# Patient Record
Sex: Female | Born: 1950 | Race: White | Hispanic: No | Marital: Single | Smoking: Former smoker
Health system: Southern US, Community
[De-identification: ages and names within clinical notes are randomized; demographics above are authoritative.]

## PROBLEM LIST (undated history)

## (undated) DIAGNOSIS — H409 Unspecified glaucoma: Secondary | ICD-10-CM

## (undated) HISTORY — PX: TONSILLECTOMY: SUR1361

## (undated) HISTORY — PX: CATARACT EXTRACTION: SUR2

---

## 2021-09-04 ENCOUNTER — Other Ambulatory Visit: Payer: Self-pay

## 2021-09-04 ENCOUNTER — Ambulatory Visit (INDEPENDENT_AMBULATORY_CARE_PROVIDER_SITE_OTHER): Payer: Medicare Other | Admitting: Podiatry

## 2021-09-04 ENCOUNTER — Encounter: Payer: Self-pay | Admitting: Podiatry

## 2021-09-04 DIAGNOSIS — B351 Tinea unguium: Secondary | ICD-10-CM | POA: Diagnosis not present

## 2021-09-04 MED ORDER — CICLOPIROX 8 % EX SOLN: Freq: Every day | CUTANEOUS | 0 refills | Status: AC

## 2021-09-04 NOTE — Progress Notes (Signed)
°  Subjective:  Patient ID: Tara Pruitt, female    DOB: 01-30-1951,   MRN: [id number]  Chief Complaint  Patient presents with   Nail Problem    Pt  came in today with discoloration of the left hallux with is starting to separate from the nail bed. Pt stated this has been going on for the last 5 years. Pt has tried over the counter nail fungus gel.      71 y.o. female presents for concern of thickened and discolored left hallux toenail that has been present for 5+ years. Has tried some listerine but nothing has helped. Not painful . Denies any other pedal complaints. Denies n/v/f/c.   No past medical history on file.  Objective:  Physical Exam: Vascular: DP/PT pulses 2/4 bilateral. CFT <3 seconds. Normal hair growth on digits. No edema.  Skin. No lacerations or abrasions bilateral feet. Discoloration and thickness noted to distal aspect of left hallux.  Musculoskeletal: MMT 5/5 bilateral lower extremities in DF, PF, Inversion and Eversion. Deceased ROM in DF of ankle joint.  Neurological: Sensation intact to light touch.   Assessment:   1. Onychomycosis      Plan:  Patient was evaluated and treated and all questions answered. -Examined patient -Discussed treatment options for painful dystrophic nails  -Discussed fungal nail treatment options including oral, topical, and laser treatments.  -Prescirpiton for penlac provided.  -Patient to return as needed.   Lorenda Peck, DPM

## 2021-11-01 ENCOUNTER — Emergency Department (HOSPITAL_BASED_OUTPATIENT_CLINIC_OR_DEPARTMENT_OTHER): Payer: Medicare Other

## 2021-11-01 ENCOUNTER — Other Ambulatory Visit: Payer: Self-pay

## 2021-11-01 ENCOUNTER — Emergency Department (HOSPITAL_BASED_OUTPATIENT_CLINIC_OR_DEPARTMENT_OTHER)
Admission: EM | Admit: 2021-11-01 | Discharge: 2021-11-01 | Disposition: A | Discharge status: Home / Self Care | Payer: Medicare Other | Attending: Emergency Medicine | Admitting: Emergency Medicine

## 2021-11-01 ENCOUNTER — Encounter (HOSPITAL_BASED_OUTPATIENT_CLINIC_OR_DEPARTMENT_OTHER): Payer: Self-pay | Admitting: Emergency Medicine

## 2021-11-01 DIAGNOSIS — M25551 Pain in right hip: Secondary | ICD-10-CM | POA: Insufficient documentation

## 2021-11-01 DIAGNOSIS — M5416 Radiculopathy, lumbar region: Secondary | ICD-10-CM

## 2021-11-01 DIAGNOSIS — M545 Low back pain, unspecified: Secondary | ICD-10-CM | POA: Diagnosis not present

## 2021-11-01 DIAGNOSIS — Z91040 Latex allergy status: Secondary | ICD-10-CM | POA: Diagnosis not present

## 2021-11-01 HISTORY — DX: Unspecified glaucoma: H40.9

## 2021-11-01 IMAGING — MR MR LUMBAR SPINE W/O CM
4 of 5 series · 26 of 48 positions shown · non-contrast
Comparison: None.

CLINICAL DATA: Low back pain radiating down the right leg for the
past month. No injury or prior surgery.

EXAM:
MRI LUMBAR SPINE WITHOUT CONTRAST
TECHNIQUE: Multiplanar, multisequence MR imaging of the lumbar spine was
performed. No intravenous contrast was administered.

[Series 3: T2 · sagittal · 4.0mm · 0.81mm/px · 6 of 15 slices shown (1 of 2)]
[im 1/15]
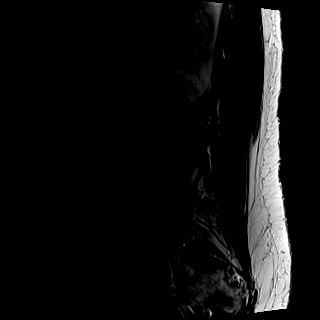
[im 3/15]
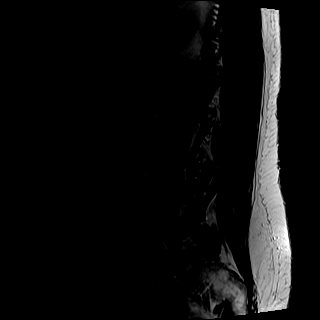
[im 6/15]
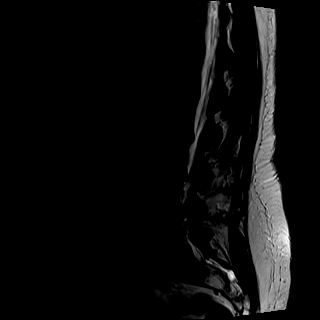
[im 9/15]
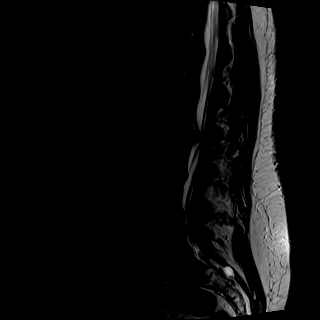
[im 12/15]
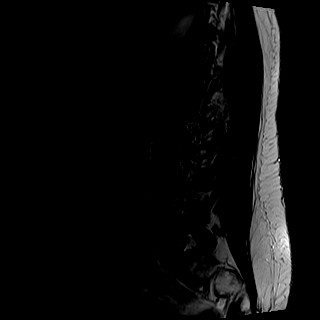
[im 15/15]
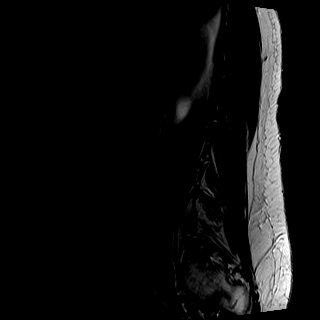

[Series 4: T1 · sagittal · 4.0mm · 0.41mm/px · 6 of 15 slices shown (1 of 2)]
[im 1/15]
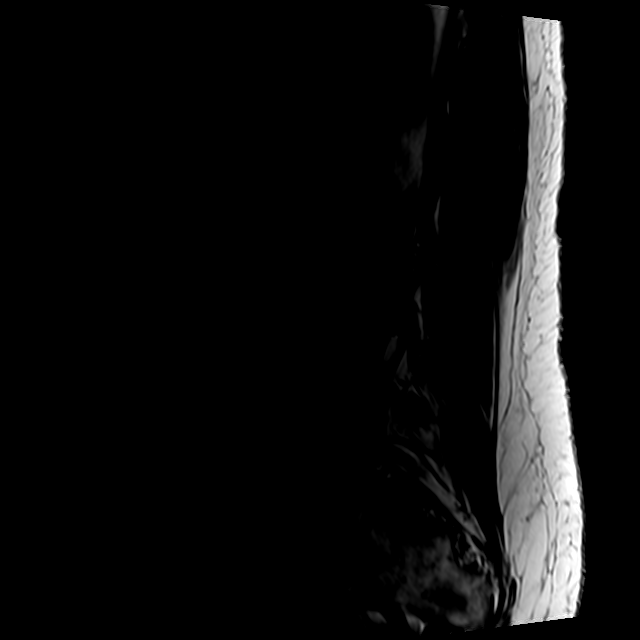
[im 3/15]
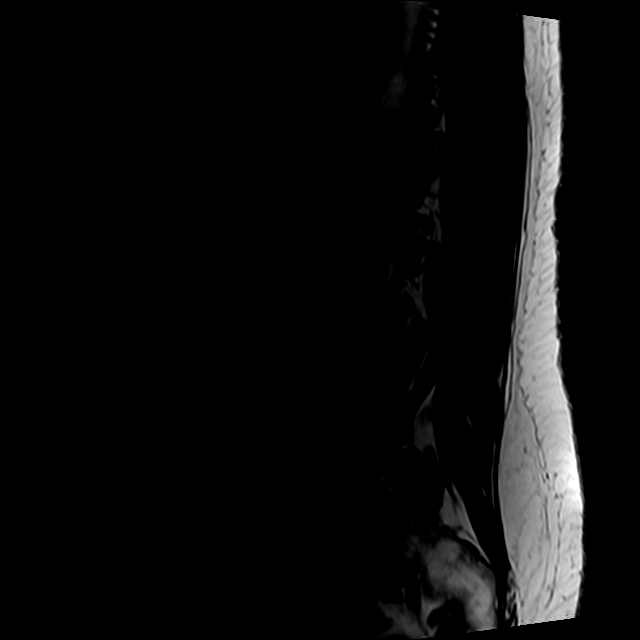
[im 6/15]
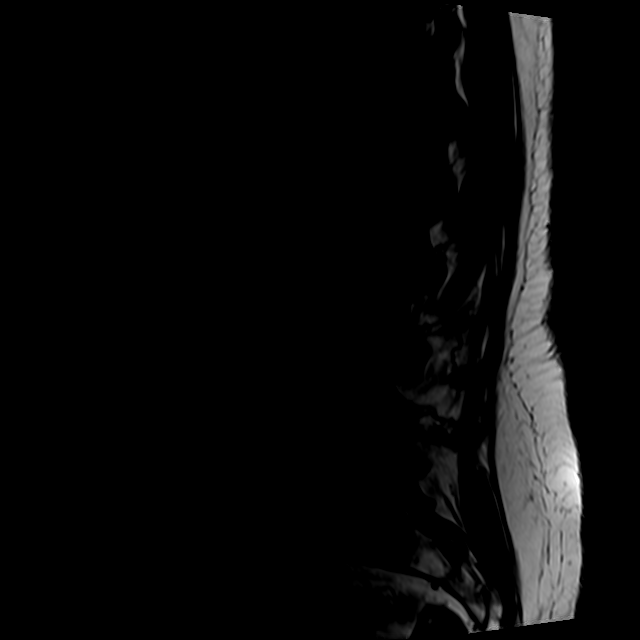
[im 9/15]
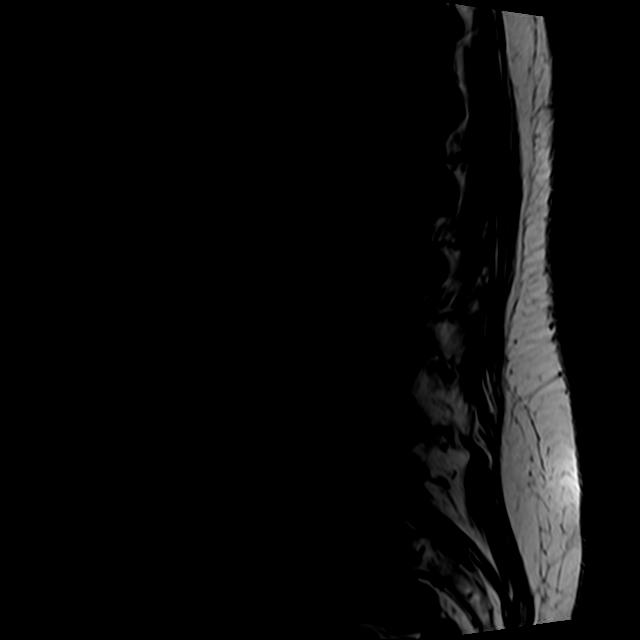
[im 12/15]
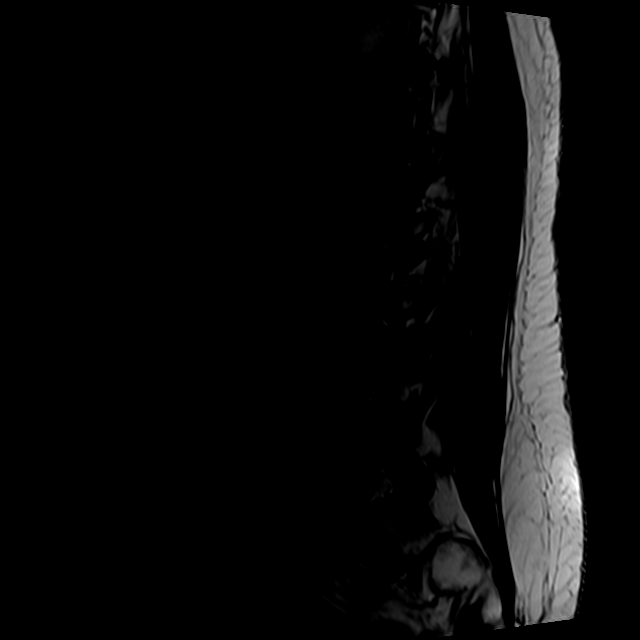
[im 15/15]
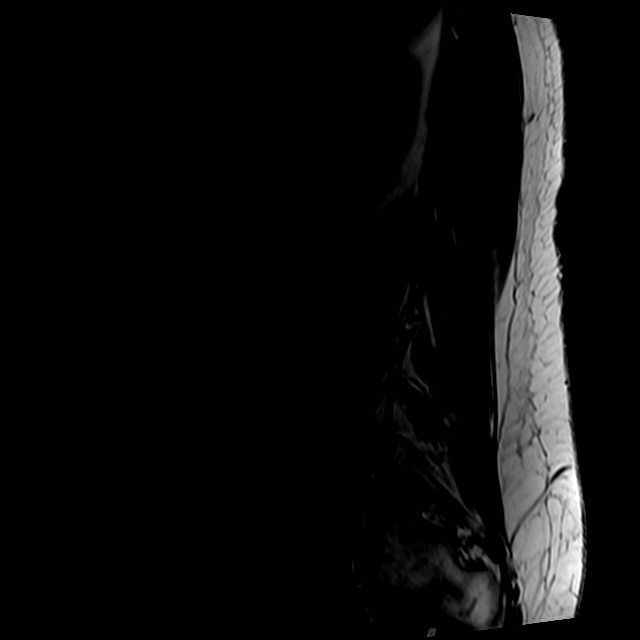

[Series 6: T2 · axial · 4.0mm · 0.78mm/px · z∈[-287,-63]mm · 9 of 40 slices shown (2 of 2)]
[im 1/40]
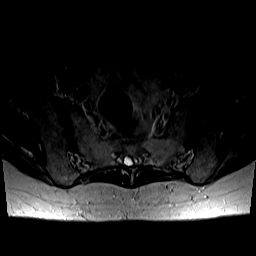
[im 6/40]
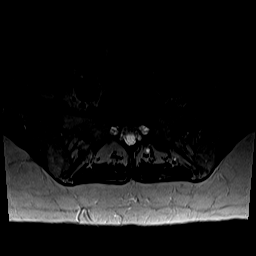
[im 12/40]
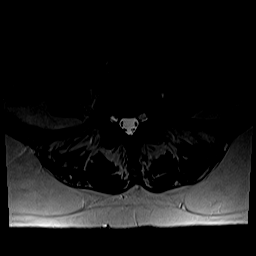
[im 17/40]
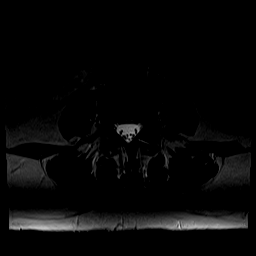
[im 20/40]
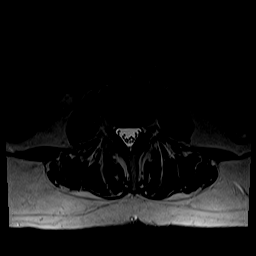
[im 23/40]
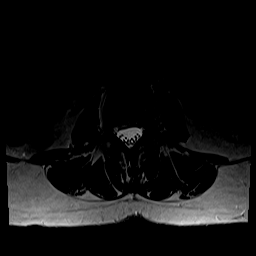
[im 28/40]
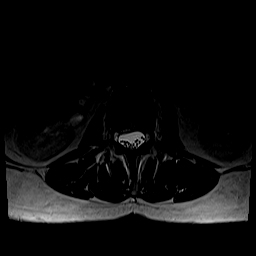
[im 34/40]
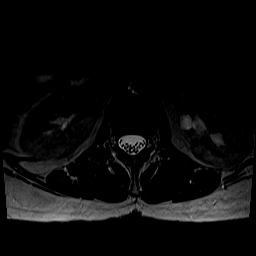
[im 40/40]
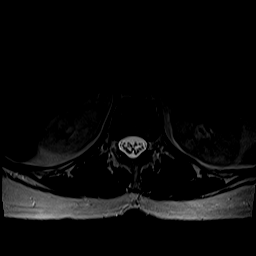

[Series 7: T1 · axial · 4.0mm · 0.39mm/px · z∈[-287,-93]mm · 5 of 40 slices shown (2 of 2)]
[im 1/40]
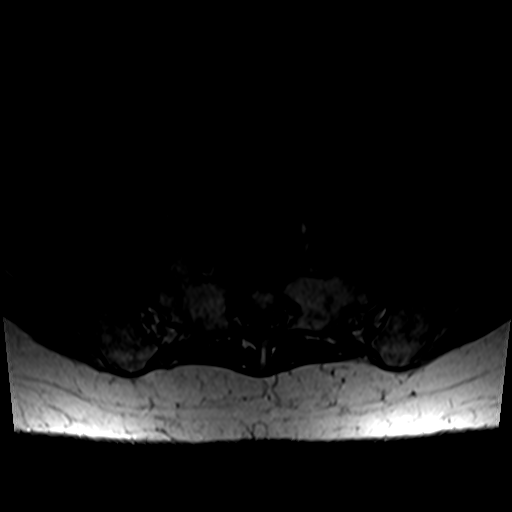
[im 6/40]
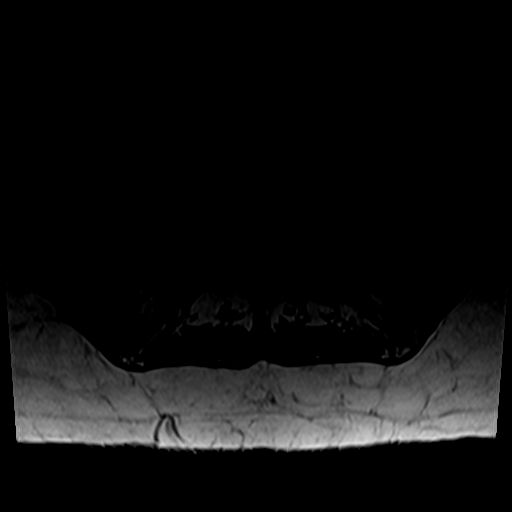
[im 12/40]
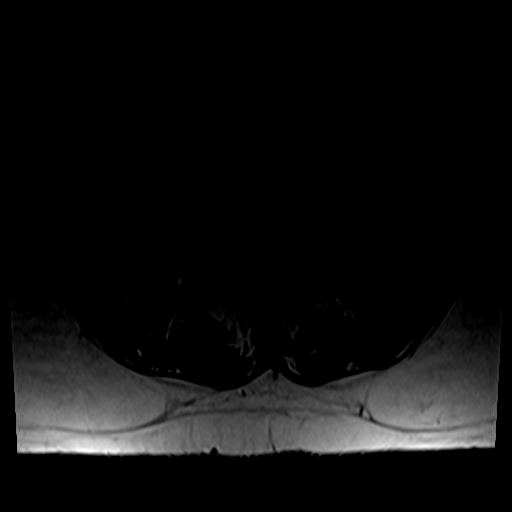
[im 20/40]
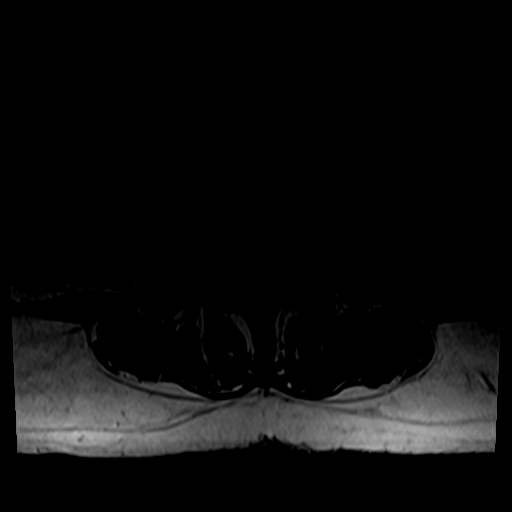
[im 34/40]
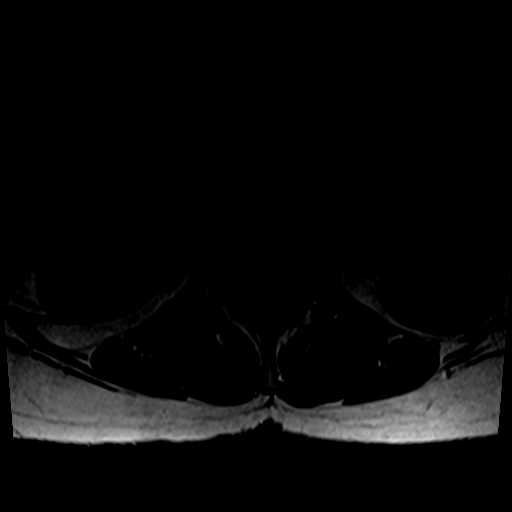

[26 of 48 positions shown; findings below may reference images not displayed]

FINDINGS: Segmentation:  Standard.

Alignment:  Trace retrolisthesis at L3-L4.

Vertebrae:  No fracture, evidence of discitis, or bone lesion.

Conus medullaris and cauda equina: Conus extends to the T12 level.
Conus and cauda equina appear normal.

Paraspinal and other soft tissues: Negative.

Disc levels:

T12-L1:  Negative.

L1-L2:  Negative.

L2-L3: Small shallow central and focal right subarticular disc
protrusions. Severe right lateral recess stenosis with impingement
of the descending right L3 nerve root. No spinal canal or
neuroforaminal stenosis.

L3-L4: Mild disc bulging. Mild to moderate left and mild right
lateral recess stenosis. No spinal canal or neuroforaminal stenosis.

L4-L5: Mild disc bulging with superimposed left foraminal and far
lateral disc protrusion. Mild bilateral facet arthropathy.
Mild-to-moderate bilateral lateral recess stenosis. Mild left
neuroforaminal stenosis. No spinal canal or right neuroforaminal
stenosis.

L5-S1: Tiny shallow central disc protrusion. Moderate left and mild
right facet arthropathy. No stenosis.
IMPRESSION: 1. Multilevel degenerative changes of the lumbar spine as described
above. Right subarticular disc protrusion at L2-L3 impinges on the
descending right L3 nerve root.
2. Mild to moderate lateral recess stenosis on the left at L3-L4 and
bilaterally at L4-L5.

## 2021-11-01 IMAGING — MR MR HIP*R* W/O CM
4 of 5 series · 29 of 40 positions shown · non-contrast
Comparison: None.

CLINICAL DATA: Low back and right leg pain for the past month. No
injury or prior surgery.

EXAM:
MR OF THE RIGHT HIP WITHOUT CONTRAST
TECHNIQUE: Multiplanar, multisequence MR imaging was performed. No intravenous
contrast was administered.

[Series 3: T1 · coronal · 4.0mm · 0.99mm/px · 8 of 36 slices shown]
[im 1/36]
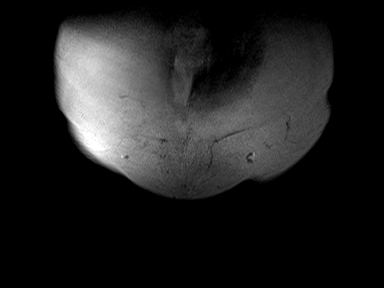
[im 4/36]
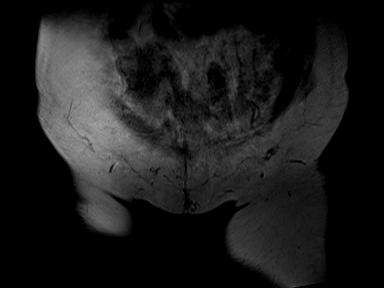
[im 12/36]
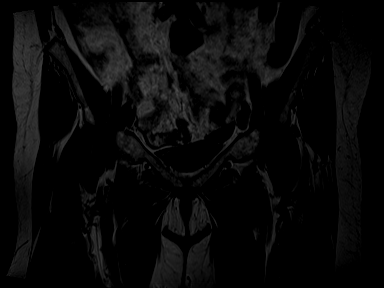
[im 16/36]
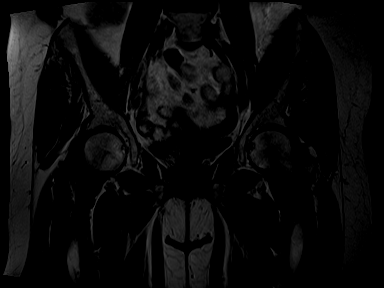
[im 20/36]
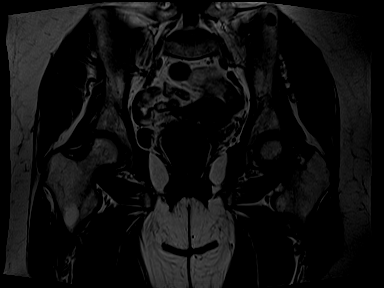
[im 24/36]
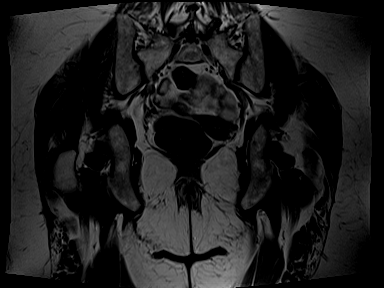
[im 32/36]
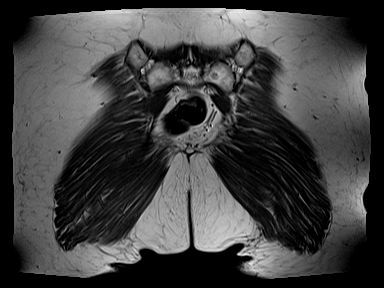
[im 36/36]
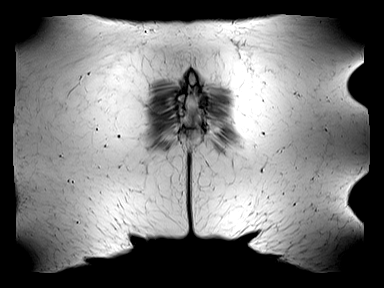

[Series 4: STIR · coronal · 4.0mm · 1.19mm/px · 8 of 36 slices shown]
[im 1/36]
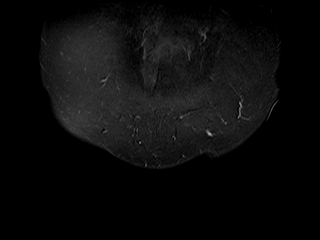
[im 5/36]
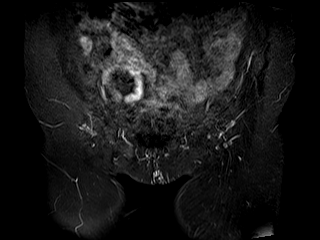
[im 9/36]
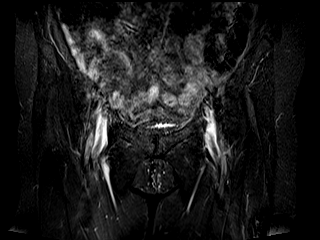
[im 14/36]
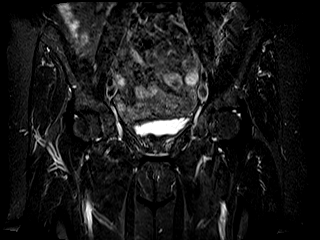
[im 18/36]
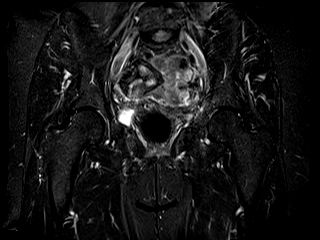
[im 22/36]
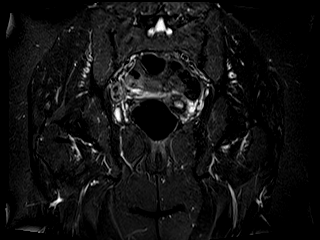
[im 27/36]
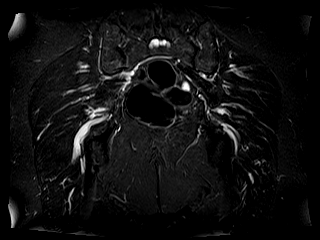
[im 31/36]
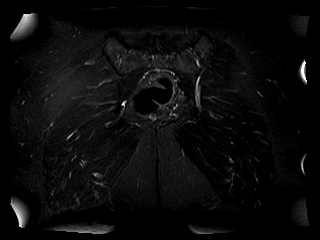

[Series 6: PD fat-sat · sagittal · 4.5mm · 0.35mm/px · 7 of 26 slices shown (1 of 2)]
[im 1/26]
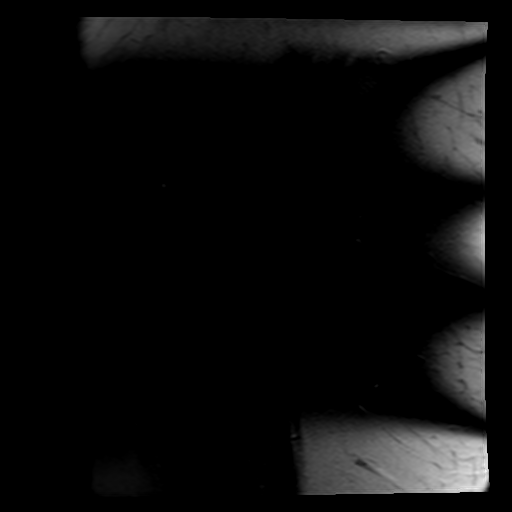
[im 5/26]
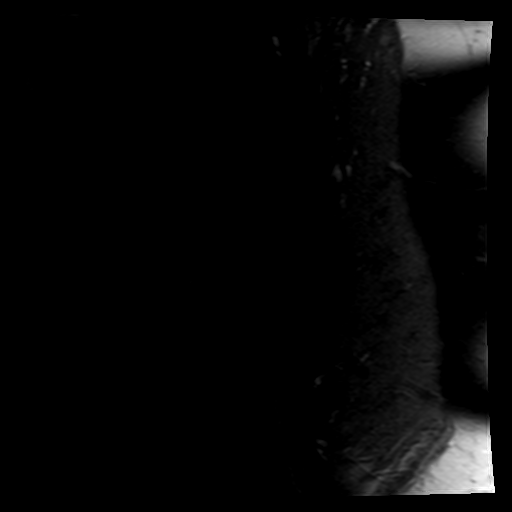
[im 9/26]
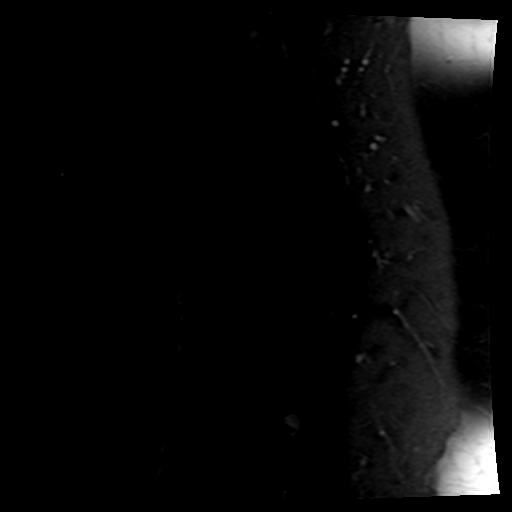
[im 13/26]
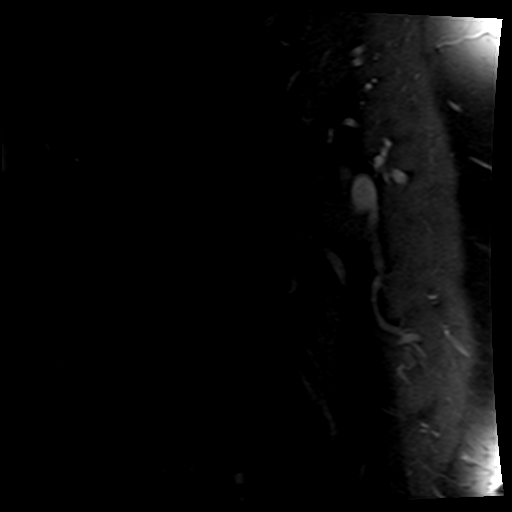
[im 17/26]
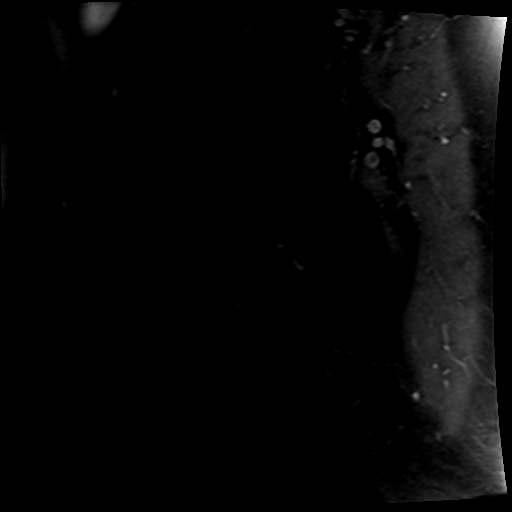
[im 21/26]
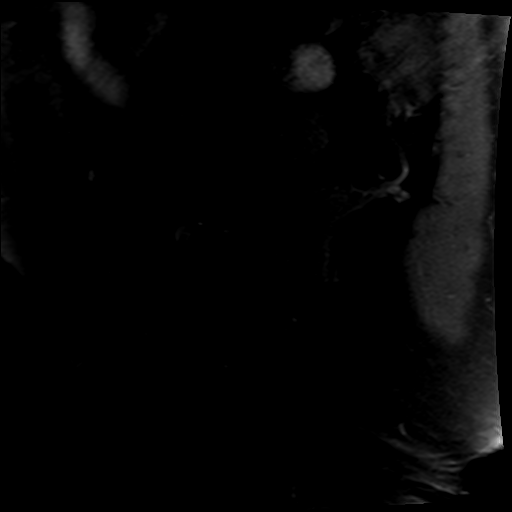
[im 26/26]
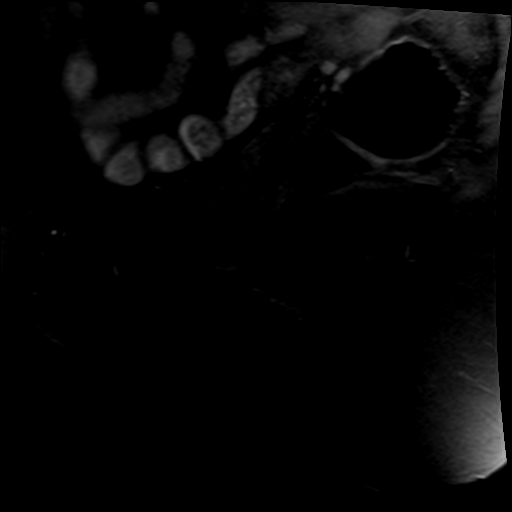

[Series 7: PD fat-sat · coronal · 4.5mm · 0.35mm/px · 6 of 23 slices shown (2 of 2)]
[im 1/23]
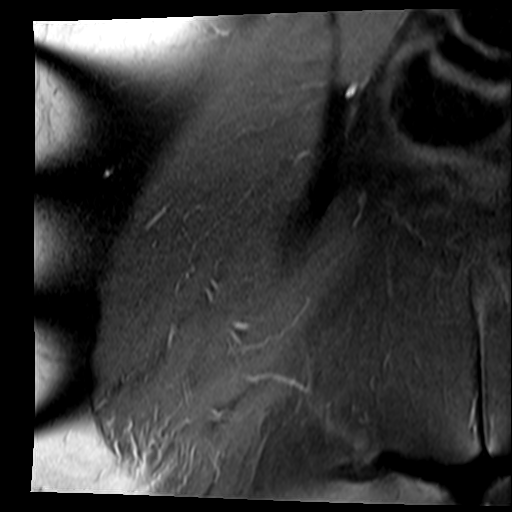
[im 5/23]
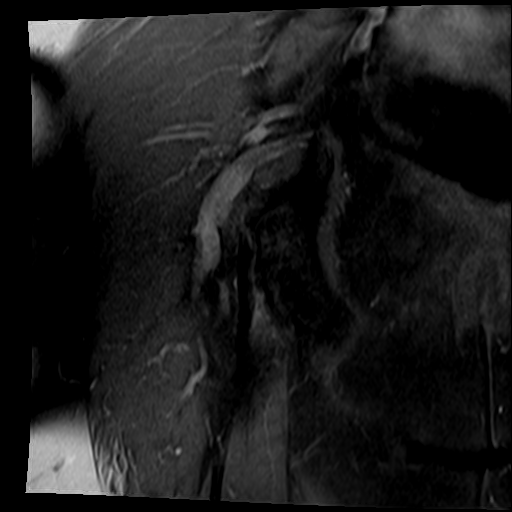
[im 9/23]
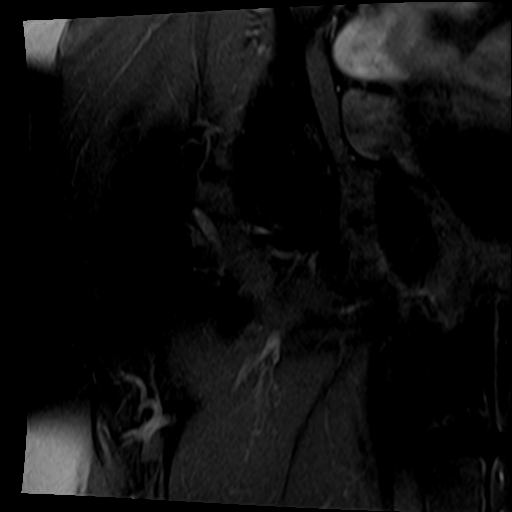
[im 14/23]
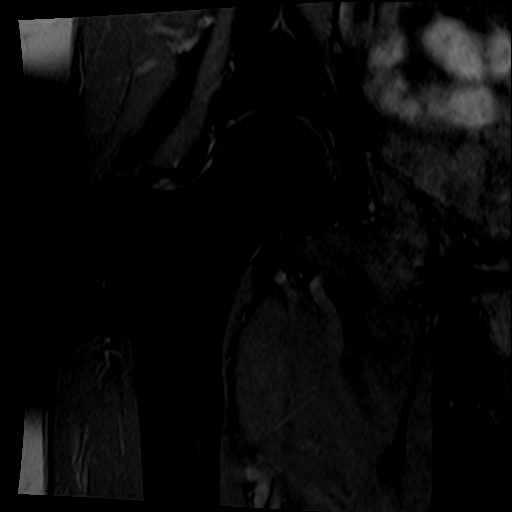
[im 18/23]
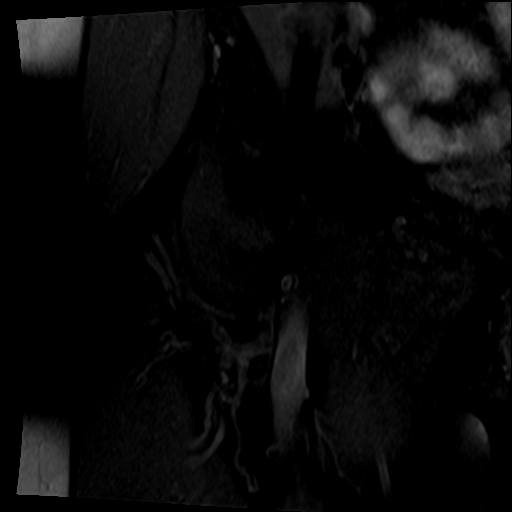
[im 23/23]
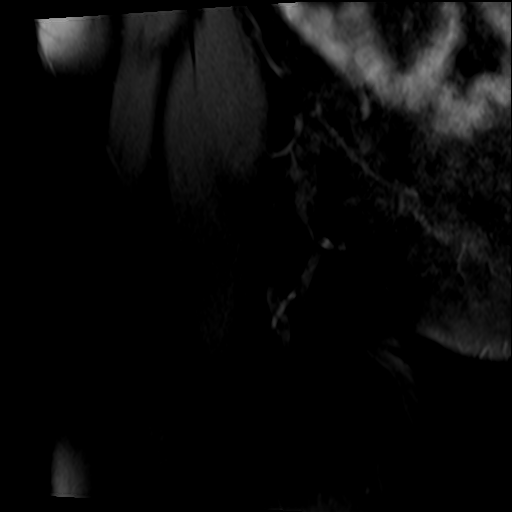

[29 of 40 positions shown; findings below may reference images not displayed]

FINDINGS: Bones: There is no evidence of acute fracture, dislocation or
avascular necrosis. No focal bone lesion. The visualized sacroiliac
joints and symphysis pubis appear normal.

Articular cartilage and labrum

Articular cartilage: Mild diffuse cartilage thinning in both hip
joints with small marginal osteophytes. No focal chondral defect.

Labrum: Grossly intact, although evaluation is limited due to lack
of intra-articular fluid. No paralabral abnormality.

Joint or bursal effusion

Joint effusion: No significant hip joint effusion.

Bursae: No focal periarticular fluid collection.

Muscles and tendons

Muscles and tendons: Mild right hamstring origin tendinosis. The
visualized gluteal, iliopsoas, and left hamstring tendons appear
normal. No muscle edema or atrophy.

Other findings

Miscellaneous: The visualized internal pelvic contents appear
unremarkable.
IMPRESSION: 1. No acute abnormality.
2. Mild bilateral hip osteoarthritis.

## 2021-11-01 MED ORDER — KETOROLAC TROMETHAMINE 60 MG/2ML IM SOLN
30.0000 mg | Freq: Once | INTRAMUSCULAR | Status: AC
  Administered 2021-11-01: 30 mg via INTRAMUSCULAR
  Filled 2021-11-01: qty 2

## 2021-11-01 MED ORDER — LIDOCAINE 5 % EX PTCH
1.0000 | MEDICATED_PATCH | CUTANEOUS | 0 refills | Status: DC
  Filled 2021-11-01: qty 30, 30d supply, fill #0

## 2021-11-01 MED ORDER — PREDNISONE 10 MG (21) PO TBPK
ORAL_TABLET | Freq: Every day | ORAL | 0 refills | Status: DC
  Filled 2021-11-01: qty 42, fill #0

## 2021-11-01 MED ORDER — OXYCODONE-ACETAMINOPHEN 5-325 MG PO TABS
2.0000 | ORAL_TABLET | Freq: Once | ORAL | Status: AC
  Administered 2021-11-01: 2 via ORAL
  Filled 2021-11-01: qty 2

## 2021-11-01 MED ORDER — PREDNISONE 10 MG (21) PO TBPK: ORAL_TABLET | Freq: Every day | ORAL | 0 refills | Status: DC

## 2021-11-01 MED ORDER — LIDOCAINE 5 % EX PTCH: 1.0000 | MEDICATED_PATCH | CUTANEOUS | 0 refills | Status: DC

## 2021-11-01 NOTE — ED Triage Notes (Signed)
Pt endorses right sided hip pain, knee pain and tingling since 3/15. Pt states she bent over to pick up a pan and felt something in her side. Pt given muscle relaxer by PCP. Pt had xray done at chiropractor and sent here for MRI.  ?

## 2021-11-01 NOTE — ED Notes (Signed)
This nurse received report from [place], EMT-P while pt OTF to MRI -- since then pt observed returning back from MRI via w/c by MRI tech; pt awake and alert; able to ambulate with 1 person min assist for safety from w/c to stretcher- no obvious acute distress noted at this time.  ?

## 2021-11-01 NOTE — ED Notes (Signed)
Patient transported to MRI 

## 2021-11-01 NOTE — Discharge Instructions (Addendum)
You have a pinched nerve at L3.  Call Dr. Luiz Blare as we discussed to schedule a follow-up visit ?

## 2021-11-01 NOTE — ED Provider Notes (Signed)
?[facility] EM[facility] DEPT ?Provider Note ? ? ?CSN: [id number] ?Arrival date & time: 11/01/21  1422 ? ?  ? ?History ? ?Chief Complaint  ?Patient presents with  ? Hip Pain  ? ? ?Tara Pruitt is a 71 y.o. female. ? ?71 year old female presents with right-sided gluteal pain x1 month.  Started after she bent over.  Since that time she has been treated by chiropractor for this.  States at times the pain goes down her right thigh.  No bowel or bladder dysfunction.  No fever or chills.  No real pain in her spine but some pain at the mid buttock around the SI joint region.  No foot drop with walking.  States that the pain is positional with it being better with certain positions of her pelvis.  Has been using heat and ice at times with some relief.  Is also been using muscle axis with some relief ? ? ?  ? ?Home Medications ?Prior to Admission medications   ?Medication Sig Start Date End Date Taking? Authorizing Provider  ?ciclopirox (PENLAC) 8 % solution Apply topically at bedtime. Apply over nail and surrounding skin. Apply daily over previous coat. After seven (7) days, may remove with alcohol and continue cycle. 09/04/21   Louann Sjogren, MD  ?   ? ?Allergies    ?Codeine and Latex   ? ?Review of Systems   ?Review of Systems  ?All other systems reviewed and are negative. ? ?Physical Exam ?Updated Vital Signs ?BP (!) 163/96 (BP Location: Right Arm)   Pulse 94   Temp 97.7 ?F (36.5 ?C)   Resp 19   Ht 1.537 m (5' 0.5")   Wt 68 kg   SpO2 100%   BMI 28.81 kg/m?  ?Physical Exam ?Vitals and nursing note reviewed.  ?Constitutional:   ?   General: She is not in acute distress. ?   Appearance: Normal appearance. She is well-developed. She is not toxic-appearing.  ?HENT:  ?   Head: Normocephalic and atraumatic.  ?Eyes:  ?   General: Lids are normal.  ?   Conjunctiva/sclera: Conjunctivae normal.  ?   Pupils: Pupils are equal, round, and reactive to light.  ?Neck:  ?   Thyroid: No thyroid mass.  ?   Trachea: No  tracheal deviation.  ?Cardiovascular:  ?   Rate and Rhythm: Normal rate and regular rhythm.  ?   Heart sounds: Normal heart sounds. No murmur heard. ?  No gallop.  ?Pulmonary:  ?   Effort: Pulmonary effort is normal. No respiratory distress.  ?   Breath sounds: Normal breath sounds. No stridor. No decreased breath sounds, wheezing, rhonchi or rales.  ?Abdominal:  ?   General: There is no distension.  ?   Palpations: Abdomen is soft.  ?   Tenderness: There is no abdominal tenderness. There is no rebound.  ?Musculoskeletal:     ?   General: No tenderness. Normal range of motion.  ?   Cervical back: Normal range of motion and neck supple.  ?     Legs: ? ?Skin: ?   General: Skin is warm and dry.  ?   Findings: No abrasion or rash.  ?Neurological:  ?   General: No focal deficit present.  ?   Mental Status: She is alert and oriented to person, place, and time. Mental status is at baseline.  ?   GCS: GCS eye subscore is 4. GCS verbal subscore is 5. GCS motor subscore is 6.  ?   Cranial  Nerves: No cranial nerve deficit.  ?   Sensory: No sensory deficit.  ?   Motor: Motor function is intact.  ?   Gait: Gait is intact.  ?Psychiatric:     ?   Attention and Perception: Attention normal.     ?   Speech: Speech normal.     ?   Behavior: Behavior normal.  ? ? ?ED Results / Procedures / Treatments   ?Labs ?(all labs ordered are listed, but only abnormal results are displayed) ?Labs Reviewed - No data to display ? ?EKG ?None ? ?Radiology ?No results found. ? ?Procedures ?Procedures  ? ? ?Medications Ordered in ED ?Medications  ?ketorolac (TORADOL) injection 30 mg (has no administration in time range)  ? ? ?ED Course/ Medical Decision Making/ A&P ?  ?                        ?Medical Decision Making ?Amount and/or Complexity of Data Reviewed ?Radiology: ordered. ? ?Risk ?Prescription drug management. ? ? ?Patient here complaining of right gluteal pain.  Given Toradol 30 mg IM for this.  Did note some relief with this.  Concern for  possible SI joint versus lumbar sacral pathology.  Given pain relief with 1 Percocet.  Patient subsequently had a MRI of her right hip and LS-spine.  Has evidence of disc protrusion at L2/L3 causing impingement of the L3 nerve root.  This was per my review and interpretation.  Patient will be placed on prednisone taper along with given lidocaine patches.  She will follow-up with her orthopedist ? ? ? ? ? ? ? ?Final Clinical Impression(s) / ED Diagnoses ?Final diagnoses:  ?None  ? ? ?Rx / DC Orders ?ED Discharge Orders   ? ? None  ? ?  ? ? ?  ?Lorre Nick, MD ?11/01/21 2016 ? ?

## 2021-11-01 NOTE — ED Notes (Signed)
ED Provider at bedside speaking pt, sitting up to chair, with spouse at her side  ?

## 2021-11-01 NOTE — ED Notes (Signed)
Pt agreeable with d/c plan as discussed by provider- this nurse has verbally reinforced d/c instructions and provided pt with written copy - pt acknowledges verbal understanding and denies any additional questions, concerns, needs- pt escorted to exit/car via w/c by this nurse and transported home by spouse.   ?

## 2021-11-02 ENCOUNTER — Encounter (HOSPITAL_BASED_OUTPATIENT_CLINIC_OR_DEPARTMENT_OTHER): Payer: Self-pay

## 2021-11-02 NOTE — ED Notes (Addendum)
Pt called regarding her newly prescribed Prednisone from Dr. Zenia Resides. She noticed Prednisone being a "caution" with a medical hx of Glaucoma. Pt encouraged to call pharmacy and/or PCP  ?

## 2021-11-04 ENCOUNTER — Other Ambulatory Visit (HOSPITAL_BASED_OUTPATIENT_CLINIC_OR_DEPARTMENT_OTHER): Payer: Self-pay

## 2022-05-01 ENCOUNTER — Ambulatory Visit (INDEPENDENT_AMBULATORY_CARE_PROVIDER_SITE_OTHER): Payer: Medicare Other | Admitting: Nurse Practitioner

## 2022-05-01 ENCOUNTER — Encounter: Payer: Self-pay | Admitting: Nurse Practitioner

## 2022-05-01 ENCOUNTER — Ambulatory Visit: Payer: Medicare Other | Admitting: Nurse Practitioner

## 2022-05-01 ENCOUNTER — Telehealth: Payer: Self-pay | Admitting: Nurse Practitioner

## 2022-05-01 VITALS — BP 160/92 | HR 101 | Temp 97.7°F | Ht 65.5 in | Wt 153.0 lb

## 2022-05-01 DIAGNOSIS — Z79899 Other long term (current) drug therapy: Secondary | ICD-10-CM

## 2022-05-01 DIAGNOSIS — R42 Dizziness and giddiness: Secondary | ICD-10-CM

## 2022-05-01 DIAGNOSIS — R030 Elevated blood-pressure reading, without diagnosis of hypertension: Secondary | ICD-10-CM

## 2022-05-01 DIAGNOSIS — H9192 Unspecified hearing loss, left ear: Secondary | ICD-10-CM | POA: Diagnosis not present

## 2022-05-01 DIAGNOSIS — H409 Unspecified glaucoma: Secondary | ICD-10-CM

## 2022-05-01 DIAGNOSIS — M545 Low back pain, unspecified: Secondary | ICD-10-CM

## 2022-05-01 DIAGNOSIS — Z7689 Persons encountering health services in other specified circumstances: Secondary | ICD-10-CM

## 2022-05-01 NOTE — Telephone Encounter (Signed)
MADE BY MISTAKE. SPOKE TO PROVIDER IN REGARDS TO [facility] audiology

## 2022-05-01 NOTE — Progress Notes (Signed)
New Patient Office Visit  Assessment & Plan  Encounter to establish care Will continue to review history and medical records from Dr. Pattricia Boss. DesChamps,  Hearing loss of left ear, unspecified hearing loss type Refer to audiology for further review and evaluation. Continue to monitor  Dizziness Change positions slowly to prevent increase risk for fall. Stay well hydrated.  Glaucoma, unspecified glaucoma type, unspecified laterality Continue Lantanoprost. Continue to monitor   Elevated blood pressure reading in office with white coat syndrome, without diagnosis of hypertension Discussed DASH (Dietary Approaches to Stop Hypertension) DASH diet is lower in sodium than a typical American diet. Cut back on foods that are high in saturated fat, cholesterol, and trans fats. Eat more whole-grain foods, fish, poultry, and nuts Remain active and exercise as tolerated daily.  Monitor BP at home-Call if greater than 130/80.  Report to ER for any increase in stroke like symptoms, including HA, N/V, paralysis, difficulty speaking, trouble walking, confusion, vision changes, CP, heart palpitations, SOB, diaphoresis.  Medication management All medications discussed and reviewed in full. All questions and concerns regarding medications addressed.    Orders Placed This Encounter  Procedures   Ambulatory referral to Audiology    Referral Priority:   Routine    Referral Type:   Audiology Exam    Referral Reason:   Specialty Services Required    Number of Visits Requested:   1   Return in 3-6 months or sooner if needed.  Notify office for further evaluation and treatment, questions or concerns if any reported s/s fail to improve.   The patient was advised to call back or seek an in-person evaluation if any symptoms worsen or if the condition fails to improve as anticipated.   Further disposition pending results of labs if blood work obtained. Discussed med's effects and SE's.    I  discussed the assessment and treatment plan with the patient. The patient was provided an opportunity to ask questions and all were answered. The patient agreed with the plan and demonstrated an understanding of the instructions.  Discussed med's effects and SE's. Screening labs and tests as requested with regular follow-up as recommended.  I provided 30 minutes of face-to-face time during this encounter including counseling, chart review, and critical decision making was preformed.   Subjective    Patient ID: Tara Pruitt, female    DOB: 02-Apr-1951  Age: 71 y.o. MRN: [id number]  CC:  Chief Complaint  Patient presents with   Establish Care    HPI Tara Pruitt presents to establish care.  She is a very pleasant female that shares around 04/2020 she retired and moved from [place].  She was followed by Pattricia Boss. DesChamps, MD.  She has recently seen a provider in the last 6 mo.  She is most concerned for left ear hearing loss that occurred approximately 13 years ago.  Happened spontaneously.  Onset was accompanied by severe dizzy spells about 4 moo prior to the hearing loss.  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  She now reports that the dizzy spell has returned, and this has her concerned for los of hearing in the right ear. She is very tearful today and in clinic regarding this issue.  Has followed with [facility] - Dr. Berenice Primas, 12/18/21 for Cortisone injection due to low back pain.  States injections are helpful helped.  Notes onset around 10/02/21 when "something popped" in her  back.  She mainly takes supplements for healthcare  maintenance.  She is very diligent about eating health.  She aims to exercise 4-5 times weekly. She does use Lantaprost for treatment of glaucoma.  She has had a hx of cataract surgery.    She follows with [facility], Dr. Lorane Gell.  Last seen  08/2021 for pap.  She does not have a hx of abnormal pap.  BP during that visit was well controlled at 128/74.  She has also seen Podiatry, Dr. Ralene Cork for treatment of Onychomycosis to left hallux.  Onset 5 years ago.   Last f/u was 09/04/21.  She was prescribed a prescription for Penlac and was asked to return PRN.  She is a former smoker.  Quit 1989.  Her blood pressure is elevated in clinic today. She reports being anxious and stressed about the hearing loss and seeking a new provider.  She denies hx of HTN.  She is asymptomatic.  Denies chest pain, heart palpitations, difficulty breathing. She reports seeing a cardiologist in the past and work up was negative.     Outpatient Encounter Medications as of 05/01/2022  Medication Sig   ascorbic acid (QC VITAMIN C WITH ROSE HIPS) 500 MG tablet Take 500 mg by mouth daily.   Ascorbic Acid (VITAMIN C) 100 MG tablet Take 100 mg by mouth daily.   B COMPLEX-C PO Take 150 mg by mouth daily.   Calcium Carbonate (CALCIUM 600 PO) Take by mouth daily.   cholecalciferol (VITAMIN D3) 25 MCG (1000 UNIT) tablet Take 5,000 Units by mouth daily.   Coenzyme Q10 (CO Q 10) 100 MG CAPS Take by mouth daily.   latanoprost (XALATAN) 0.005 % ophthalmic solution Place 1 drop into both eyes at bedtime.   Multiple Vitamin (MULTIVITAMIN) tablet Take 1 tablet by mouth daily.   naproxen sodium (ALEVE) 220 MG tablet Take 220 mg by mouth 2 (two) times daily.   POTASSIUM PO Take by mouth daily.   QUERCETIN PO Take 800 mg by mouth 2 (two) times daily.   Selenium 200 MCG CAPS Take by mouth. Take 1/2 tablet in the morning and at night   TART CHERRY PO Take by mouth daily.   vitamin E 1000 UNIT capsule Take 1,000 Units by mouth daily.   zinc gluconate 50 MG tablet Take 50 mg by mouth daily.   Barberry-Oreg Grape-Goldenseal (BERBERINE COMPLEX) 200-200-50 MG CAPS Take by mouth. (Patient not taking: Reported on 05/01/2022)   ciclopirox (PENLAC) 8 % solution Apply topically at  bedtime. Apply over nail and surrounding skin. Apply daily over previous coat. After seven (7) days, may remove with alcohol and continue cycle. (Patient not taking: Reported on 05/01/2022)   lidocaine (LIDODERM) 5 % Place 1 patch onto the skin daily. Remove & Discard patch within 12 hours or as directed by MD (Patient not taking: Reported on 05/01/2022)   magnesium 30 MG tablet Take 30 mg by mouth 2 (two) times daily. (Patient not taking: Reported on 05/01/2022)   methocarbamol (ROBAXIN) 500 MG tablet Take 500 mg by mouth 4 (four) times daily. (Patient not taking: Reported on 05/01/2022)   predniSONE (STERAPRED UNI-PAK 21 TAB) 10 MG (21) TBPK tablet Take by mouth daily. Take 6 tabs by mouth daily  for 2 days, then 5 tabs for 2 days, then 4 tabs for 2 days, then 3 tabs for 2 days, 2 tabs for 2 days, then 1 tab by mouth daily for 2 days (Patient not taking: Reported on 05/01/2022)  No facility-administered encounter medications on file as of 05/01/2022.    Past Medical History:  Diagnosis Date   Glaucoma     Past Surgical History:  Procedure Laterality Date   CATARACT EXTRACTION     CESAREAN SECTION     TONSILLECTOMY      No family history on file.  Social History   Socioeconomic History   Marital status: Unknown    Spouse name: Not on file   Number of children: Not on file   Years of education: Not on file   Highest education level: Not on file  Occupational History   Not on file  Tobacco Use   Smoking status: Former    Types: Cigarettes    Quit date: 1989    Years since quitting: 34.8   Smokeless tobacco: Never  Substance and Sexual Activity   Alcohol use: Never   Drug use: Never   Sexual activity: Not on file  Other Topics Concern   Not on file  Social History Narrative   Not on file   Social Determinants of Health   Financial Resource Strain: Not on file  Food Insecurity: Not on file  Transportation Needs: Not on file  Physical Activity: Not on file  Stress:  Not on file  Social Connections: Not on file  Intimate Partner Violence: Not on file    Review of Systems  Constitutional: Negative.   HENT:  Positive for hearing loss.   Eyes: Negative.   Respiratory: Negative.    Cardiovascular: Negative.   Gastrointestinal: Negative.   Genitourinary: Negative.   Musculoskeletal: Negative.   Skin: Negative.   Neurological:  Positive for dizziness.  Psychiatric/Behavioral:  The patient is nervous/anxious.       Objective    BP (!) 160/92   Pulse (!) 101   Temp 97.7 F (36.5 C)   Ht 5' 5.5" (1.664 m)   Wt 153 lb (69.4 kg)   SpO2 99%   BMI 25.07 kg/m   Physical Exam Constitutional:      Appearance: Normal appearance.  HENT:     Head: Normocephalic.     Right Ear: Tympanic membrane normal.     Left Ear: Tympanic membrane normal.     Nose: Nose normal.     Mouth/Throat:     Mouth: Mucous membranes are moist.  Eyes:     Pupils: Pupils are equal, round, and reactive to light.  Cardiovascular:     Rate and Rhythm: Normal rate and regular rhythm.     Pulses: Normal pulses.     Heart sounds: Normal heart sounds.  Pulmonary:     Effort: Pulmonary effort is normal.     Breath sounds: Normal breath sounds.  Abdominal:     General: Abdomen is flat.  Musculoskeletal:        General: Normal range of motion.     Cervical back: Normal range of motion.  Skin:    General: Skin is warm.  Neurological:     Mental Status: She is alert.  Psychiatric:        Mood and Affect: Mood normal.        Thought Content: Thought content normal.     Comments: tearful     No follow-ups on file.   Adela Glimpse, NP

## 2022-05-01 NOTE — Patient Instructions (Signed)
    RE: MyChart  Dear Ms. Schaad  MyChart makes it easy for you to view your health information - all in one secure location - from any computer or mobile device at any time. Use the activation code below to enroll in MyChart online at [email or web address]   Once your account is activated, you can:  View your test results. Communicate securely with your physician's office.  View your medical history, allergies, medications, and immunizations. Receive care virtually through an e-Visit.   If you are over 18, you may use features of MyChart to manage the health information of your spouse, children or others you care for.  Download child and adult access forms at [email or web address].    As you activate your MyChart account and need any technical assistance, please call the MyChart technical support line at [phone]).  Be sure to also download the MyChart app for your mobile device.   Thank you for choosing [facility] for your family's health care needs!   MyChart Activation Code:  [email or web address] Expires: 06/09/2022  8:40 AM             [facility]  [address] [place], [place] [address]

## 2022-05-07 ENCOUNTER — Ambulatory Visit: Payer: Medicare Other | Admitting: Nurse Practitioner

## 2022-05-22 ENCOUNTER — Encounter: Payer: Self-pay | Admitting: Nurse Practitioner

## 2022-05-22 ENCOUNTER — Ambulatory Visit (INDEPENDENT_AMBULATORY_CARE_PROVIDER_SITE_OTHER): Payer: Medicare Other | Admitting: Nurse Practitioner

## 2022-05-22 VITALS — BP 156/96 | HR 92 | Temp 97.3°F | Ht 65.5 in | Wt 155.4 lb

## 2022-05-22 DIAGNOSIS — H9192 Unspecified hearing loss, left ear: Secondary | ICD-10-CM

## 2022-05-22 DIAGNOSIS — R030 Elevated blood-pressure reading, without diagnosis of hypertension: Secondary | ICD-10-CM

## 2022-05-22 DIAGNOSIS — R42 Dizziness and giddiness: Secondary | ICD-10-CM

## 2022-05-22 NOTE — Patient Instructions (Signed)

## 2022-05-22 NOTE — Progress Notes (Signed)
New Patient Office Visit  Assessment & Plan  Hearing loss of left ear, unspecified hearing loss type Continue referral to Audiology.  Elevated blood pressure reading in office with white coat syndrome, without diagnosis of hypertension Reviewed BP log - controlled. Continue to focus on DASH (Dietary Approaches to Stop Hypertension) DASH diet is lower in sodium than a typical American diet. Cut back on foods that are high in saturated fat, cholesterol, and trans fats. Eat more whole-grain foods, fish, poultry, and nuts Remain active and exercise as tolerated daily.  Monitor BP at home-Call if greater than 130/80.  Report to ER for any increase in stroke like symptoms, including HA, N/V, paralysis, difficulty speaking, trouble walking, confusion, vision changes, CP, heart palpitations, SOB, diaphoresis.  Dizziness/Electromagnetic hypersensitivity Paperwork to be reviewed by MD Continue to monitor  No orders of the defined types were placed in this encounter.  Notify office for further evaluation and treatment, questions or concerns if any reported s/s fail to improve.   The patient was advised to call back or seek an in-person evaluation if any symptoms worsen or if the condition fails to improve as anticipated.   Further disposition pending results of labs if blood work obtained. Discussed med's effects and SE's.    I discussed the assessment and treatment plan with the patient. The patient was provided an opportunity to ask questions and all were answered. The patient agreed with the plan and demonstrated an understanding of the instructions.  Discussed med's effects and SE's. Screening labs and tests as requested with regular follow-up as recommended.  I provided 20 minutes of face-to-face time during this encounter including counseling, chart review, and critical decision making was preformed.   Subjective    Patient ID: Tara Pruitt, female    DOB: 07-01-1951  Age: 71 y.o.  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    Currently waiting on medical records for review by last provider, Karalee Height. DesChamps, MD, [place].  She has a audiology referral pending for review and evaluation of the left ear sensorineural hearing loss.   She has not had any increase in symptoms in the right ear however, she does report that she feels as though some of her dizziness is caused by electromagnetic sensitivity.   Reports electrical company wanting to change out her electrical meter to a smart meter.  She requests that this not be completed d/t the high amount of frequency the smart meter will emit.  She feels as though this will worsen symptoms. Reports a hx of feeling syncopal during these times,associated HA and overall feeling of being unwell.  These symptoms can also occur while talking on her cell phone.  States that she only uses speaker phone and does not hold the device up to the ear.  She has a form from the electrical company that she is requesting to be reviewed and signed that relates to this medical condition. She also has a note written  from past provider, Dr. 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  Her blood pressure continues to be elevated in clinic today. She continues to report high stress taking care of an estate and dealing with contracts and lawyers.   She has a BP log on hand for review.   She is asymptomatic.  Denies chest pain, heart  palpitations, difficulty breathing.    Outpatient Encounter Medications as of 05/22/2022  Medication Sig   ascorbic acid (QC VITAMIN C WITH ROSE HIPS) 500 MG tablet Take 500 mg by mouth daily.   Ascorbic Acid (VITAMIN C) 100 MG tablet Take 100 mg by mouth daily.   B COMPLEX-C PO Take 150 mg by mouth daily.   Barberry-Oreg  Grape-Goldenseal (BERBERINE COMPLEX) 200-200-50 MG CAPS Take 600 mg by mouth 2 (two) times daily.   Bilberry 500 MG CAPS Take by mouth 2 (two) times daily.   Calcium Carbonate (CALCIUM 600 PO) Take by mouth daily.   cholecalciferol (VITAMIN D3) 25 MCG (1000 UNIT) tablet Take 5,000 Units by mouth daily.   Coenzyme Q10 (CO Q 10) 100 MG CAPS Take by mouth daily.   Krill Oil 500 MG CAPS Take by mouth daily.   latanoprost (XALATAN) 0.005 % ophthalmic solution Place 1 drop into both eyes at bedtime.   magnesium 30 MG tablet Take 30 mg by mouth 2 (two) times daily.   methocarbamol (ROBAXIN) 500 MG tablet Take 500 mg by mouth 4 (four) times daily.   Multiple Vitamin (MULTIVITAMIN) tablet Take 1 tablet by mouth daily.   naproxen sodium (ALEVE) 220 MG tablet Take 220 mg by mouth 2 (two) times daily.   POTASSIUM PO Take by mouth daily.   QUERCETIN PO Take 800 mg by mouth 2 (two) times daily.   Selenium 200 MCG CAPS Take by mouth. Take 1/2 tablet in the morning and at night   TART CHERRY PO Take by mouth daily.   vitamin E 1000 UNIT capsule Take 1,000 Units by mouth daily.   zinc gluconate 50 MG tablet Take 50 mg by mouth daily.   ciclopirox (PENLAC) 8 % solution Apply topically at bedtime. Apply over nail and surrounding skin. Apply daily over previous coat. After seven (7) days, may remove with alcohol and continue cycle. (Patient not taking: Reported on 05/01/2022)   lidocaine (LIDODERM) 5 % Place 1 patch onto the skin daily. Remove & Discard patch within 12 hours or as directed by MD (Patient not taking: Reported on 05/01/2022)   predniSONE (STERAPRED UNI-PAK 21 TAB) 10 MG (21) TBPK tablet Take by mouth daily. Take 6 tabs by mouth daily  for 2 days, then 5 tabs for 2 days, then 4 tabs for 2 days, then 3 tabs for 2 days, 2 tabs for 2 days, then 1 tab by mouth daily for 2 days (Patient not taking: Reported on 05/01/2022)   No facility-administered encounter medications on file as of 05/22/2022.    Past  Medical History:  Diagnosis Date   Glaucoma     Past Surgical History:  Procedure Laterality Date   CATARACT EXTRACTION     CESAREAN SECTION     TONSILLECTOMY      No family history on file.  Social History   Socioeconomic History   Marital status: Unknown    Spouse name: Not on file   Number of children: Not on file   Years of education: Not on file   Highest education level: Not on file  Occupational History   Not on file  Tobacco Use   Smoking status: Former    Types: Cigarettes    Quit date: 1989    Years since quitting: 34.8   Smokeless tobacco: Never  Substance and Sexual Activity   Alcohol use: Never   Drug use: Never   Sexual activity: Not on file  Other Topics Concern   Not  on file  Social History Narrative   Not on file   Social Determinants of Health   Financial Resource Strain: Not on file  Food Insecurity: Not on file  Transportation Needs: Not on file  Physical Activity: Not on file  Stress: Not on file  Social Connections: Not on file  Intimate Partner Violence: Not on file    Review of Systems  Constitutional: Negative.   HENT:  Positive for hearing loss.   Eyes: Negative.   Respiratory: Negative.    Cardiovascular: Negative.   Gastrointestinal: Negative.   Genitourinary: Negative.   Musculoskeletal: Negative.   Skin: Negative.   Neurological:  Positive for dizziness.  Psychiatric/Behavioral:  The patient is nervous/anxious.       Objective    BP (!) 156/96   Pulse 92   Temp (!) 97.3 F (36.3 C)   Ht 5' 5.5" (1.664 m)   Wt 155 lb 6.4 oz (70.5 kg)   SpO2 98%   BMI 25.47 kg/m   Physical Exam Constitutional:      Appearance: Normal appearance.  HENT:     Head: Normocephalic.     Right Ear: Tympanic membrane normal.     Left Ear: Tympanic membrane normal.     Nose: Nose normal.     Mouth/Throat:     Mouth: Mucous membranes are moist.  Eyes:     Pupils: Pupils are equal, round, and reactive to light.  Cardiovascular:      Rate and Rhythm: Normal rate and regular rhythm.     Pulses: Normal pulses.     Heart sounds: Normal heart sounds.  Pulmonary:     Effort: Pulmonary effort is normal.     Breath sounds: Normal breath sounds.  Abdominal:     General: Abdomen is flat.  Musculoskeletal:        General: Normal range of motion.     Cervical back: Normal range of motion.  Skin:    General: Skin is warm.  Neurological:     Mental Status: She is alert.  Psychiatric:        Mood and Affect: Mood normal.        Thought Content: Thought content normal.     Comments: tearful     No follow-ups on file.   Darrol Jump, NP

## 2022-06-11 ENCOUNTER — Other Ambulatory Visit: Payer: Self-pay | Admitting: Obstetrics and Gynecology

## 2022-06-11 DIAGNOSIS — N644 Mastodynia: Secondary | ICD-10-CM

## 2022-06-27 ENCOUNTER — Other Ambulatory Visit: Payer: Medicare Other

## 2022-07-25 ENCOUNTER — Ambulatory Visit: Admission: RE | Admit: 2022-07-25 | Payer: Medicare Other | Source: Ambulatory Visit

## 2022-07-25 ENCOUNTER — Ambulatory Visit
Admission: RE | Admit: 2022-07-25 | Discharge: 2022-07-25 | Disposition: A | Discharge status: Home / Self Care | Payer: Medicare Other | Source: Ambulatory Visit | Attending: Obstetrics and Gynecology | Admitting: Obstetrics and Gynecology

## 2022-07-25 DIAGNOSIS — N644 Mastodynia: Secondary | ICD-10-CM

## 2022-08-25 ENCOUNTER — Ambulatory Visit: Payer: Medicare Other | Admitting: Nurse Practitioner

## 2022-09-11 ENCOUNTER — Ambulatory Visit: Payer: Medicare Other | Admitting: Nurse Practitioner

## 2022-10-01 ENCOUNTER — Ambulatory Visit (INDEPENDENT_AMBULATORY_CARE_PROVIDER_SITE_OTHER): Payer: Medicare Other | Admitting: Nurse Practitioner

## 2022-10-01 ENCOUNTER — Encounter: Payer: Self-pay | Admitting: Nurse Practitioner

## 2022-10-01 VITALS — BP 168/98 | HR 87 | Temp 97.5°F | Ht 65.5 in | Wt 157.8 lb

## 2022-10-01 DIAGNOSIS — R030 Elevated blood-pressure reading, without diagnosis of hypertension: Secondary | ICD-10-CM | POA: Diagnosis not present

## 2022-10-01 DIAGNOSIS — Z79899 Other long term (current) drug therapy: Secondary | ICD-10-CM | POA: Diagnosis not present

## 2022-10-01 DIAGNOSIS — H9192 Unspecified hearing loss, left ear: Secondary | ICD-10-CM

## 2022-10-01 DIAGNOSIS — H409 Unspecified glaucoma: Secondary | ICD-10-CM | POA: Diagnosis not present

## 2022-10-01 DIAGNOSIS — M545 Low back pain, unspecified: Secondary | ICD-10-CM

## 2022-10-01 NOTE — Patient Instructions (Signed)

## 2022-10-01 NOTE — Progress Notes (Signed)
Follow Up  Assessment & Plan  Hearing loss of left ear, unspecified hearing loss type Continue to establish with [facility] Continue to monitor  Glaucoma, unspecified glaucoma type, unspecified laterality Dr. Katy Fitch following  Continue Lantanoprost. Continue to monitor  Elevated blood pressure reading in office with white coat syndrome, without diagnosis of hypertension Discussed DASH (Dietary Approaches to Stop Hypertension) DASH diet is lower in sodium than a typical American diet. Cut back on foods that are high in saturated fat, cholesterol, and trans fats. Eat more whole-grain foods, fish, poultry, and nuts Remain active and exercise as tolerated daily.  Monitor BP at home-Call if greater than 130/80.  Report to ER for any increase in stroke like symptoms, including HA, N/V, paralysis, difficulty speaking, trouble walking, confusion, vision changes, CP, heart palpitations, SOB, diaphoresis.  Medication management All medications discussed and reviewed in full. All questions and concerns regarding medications addressed.    Low Back Pain Continue to follow with Orthopedics.   Notify office for further evaluation and treatment, questions or concerns if any reported s/s fail to improve.   The patient was advised to call back or seek an in-person evaluation if any symptoms worsen or if the condition fails to improve as anticipated.   Further disposition pending results of labs if blood work obtained. Discussed med's effects and SE's.    I discussed the assessment and treatment plan with the patient. The patient was provided an opportunity to ask questions and all were answered. The patient agreed with the plan and demonstrated an understanding of the instructions.  Discussed med's effects and SE's. Screening labs and tests as requested with regular follow-up as recommended.  I provided 25 minutes of face-to-face time during this encounter including counseling, chart review,  and critical decision making was preformed.  Today's Plan of Care is based on a patient-centered health care approach known as shared decision making - the decisions, tests and treatments allow for patient preferences and values to be balanced with clinical evidence.    Subjective    Patient ID: Tara Pruitt, female    DOB: 12/16/1950  Age: 72 y.o. MRN: [id number]  CC:  Chief Complaint  Patient presents with   Follow-up    HPI Tara Pruitt presents for a general follow up.  She is a very pleasant female that shares around 04/2020 she retired and moved from [place].  She was followed by Pattricia Boss. DesChamps, MD.  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 Onset was accompanied by severe dizzy spells about 4 moo prior to the hearing loss.  She was worked up in [employer] by hearing specialist who informed her that she had developed  sensorineural hearing loss and became deaf.  During establishment here she was referred to Audiology, [facility] in 04/2022.  They did defer her to [facility] where cochlear implants were discussed but she wishes to defer at this time.  She was then referred to [facility] through her GYN.  She plans to reach out to them.  She follows with [facility], Dr. Mardelle Matte.  Last seen 08/2021 for pap.  She does not have a hx of abnormal pap.  BP during that visit was well controlled at 128/74. Recently saw Dr. Mardelle Matte, [facility] 05/2022.  Had mammogram completed 07/2022.  She was complaining of right breast pain and underwent an ultrasound.  Continues to follow as directed.  During that  visit BP was sell controlled.   Has followed with [facility] - Dr. Berenice Primas, 12/18/21 for Cortisone injection due to low back pain.  States injections are helpful helped.  Notes onset around 10/02/21 when "something popped" in her back.  She mainly takes supplements for healthcare   maintenance.  She is very diligent about eating health.  She aims to exercise 4-5 times weekly. She does use Lantaprost for treatment of glaucoma.  She has had a hx of cataract surgery.  Recently followed with Dr. Katy Fitch for glaucoma update and pressure check.  She is set to return in 3 months for evaluation of any additional medications.   She has also seen Podiatry, Dr. Blenda Mounts for treatment of Onychomycosis to left hallux.  Onset 5 years ago.   Last f/u was 09/04/21.  She was prescribed a prescription for Penlac and was asked to return PRN.  She is a former smoker.  Quit 1989.  Her blood pressure is elevated in clinic today. She reports being anxious and stressed about the hearing loss and seeking a new provider.  She denies hx of HTN.  She is asymptomatic.  Denies chest pain, heart palpitations, difficulty breathing. She reports seeing a cardiologist in the past and work up was negative.  She states that home readings are well controlled and stay well controlled during other office visits.   Outpatient Encounter Medications as of 10/01/2022  Medication Sig   ascorbic acid (QC VITAMIN C WITH ROSE HIPS) 500 MG tablet Take 500 mg by mouth daily.   B COMPLEX-C PO Take 150 mg by mouth daily.   Barberry-Oreg Grape-Goldenseal (BERBERINE COMPLEX) 200-200-50 MG CAPS Take 600 mg by mouth 2 (two) times daily.   Bilberry 500 MG CAPS Take by mouth 2 (two) times daily.   Calcium Carbonate (CALCIUM 600 PO) Take by mouth daily.   cholecalciferol (VITAMIN D3) 25 MCG (1000 UNIT) tablet Take 5,000 Units by mouth daily.   Krill Oil 500 MG CAPS Take by mouth daily.   latanoprost (XALATAN) 0.005 % ophthalmic solution Place 1 drop into both eyes at bedtime.   magnesium 30 MG tablet Take 30 mg by mouth 2 (two) times daily.   Multiple Vitamin (MULTIVITAMIN) tablet Take 1 tablet by mouth daily.   naproxen sodium (ALEVE) 220 MG tablet Take 220 mg by mouth 2 (two) times daily.   POTASSIUM PO Take by mouth daily.    QUERCETIN PO Take 800 mg by mouth 2 (two) times daily.   Selenium 200 MCG CAPS Take by mouth. Take 1/2 tablet in the morning and at night   TART CHERRY PO Take by mouth daily.   vitamin E 1000 UNIT capsule Take 1,000 Units by mouth daily.   zinc gluconate 50 MG tablet Take 50 mg by mouth daily.   Ascorbic Acid (VITAMIN C) 100 MG tablet Take 100 mg by mouth daily. (Patient not taking: Reported on 10/01/2022)   ciclopirox (PENLAC) 8 % solution Apply topically at bedtime. Apply over nail and surrounding skin. Apply daily over previous coat. After seven (7) days, may remove with alcohol and continue cycle. (Patient not taking: Reported on 05/01/2022)   Coenzyme Q10 (CO Q 10) 100 MG CAPS Take by mouth daily. (Patient not taking: Reported on 10/01/2022)   lidocaine (LIDODERM) 5 % Place 1 patch onto the skin daily. Remove & Discard patch within 12 hours or as directed by MD (Patient not taking: Reported on 05/01/2022)   methocarbamol (ROBAXIN) 500 MG tablet Take 500 mg by  mouth 4 (four) times daily. (Patient not taking: Reported on 10/01/2022)   predniSONE (STERAPRED UNI-PAK 21 TAB) 10 MG (21) TBPK tablet Take by mouth daily. Take 6 tabs by mouth daily  for 2 days, then 5 tabs for 2 days, then 4 tabs for 2 days, then 3 tabs for 2 days, 2 tabs for 2 days, then 1 tab by mouth daily for 2 days   No facility-administered encounter medications on file as of 10/01/2022.    Past Medical History:  Diagnosis Date   Glaucoma     Past Surgical History:  Procedure Laterality Date   CATARACT EXTRACTION     CESAREAN SECTION     TONSILLECTOMY      No family history on file.  Social History   Socioeconomic History   Marital status: Unknown    Spouse name: Not on file   Number of children: Not on file   Years of education: Not on file   Highest education level: Not on file  Occupational History   Not on file  Tobacco Use   Smoking status: Former    Types: Cigarettes    Quit date: 45    Years since  quitting: 35.2   Smokeless tobacco: Never  Substance and Sexual Activity   Alcohol use: Never   Drug use: Never   Sexual activity: Not on file  Other Topics Concern   Not on file  Social History Narrative   Not on file   Social Determinants of Health   Financial Resource Strain: Not on file  Food Insecurity: Not on file  Transportation Needs: Not on file  Physical Activity: Not on file  Stress: Not on file  Social Connections: Not on file  Intimate Partner Violence: Not on file    Review of Systems  Constitutional: Negative.   HENT:  Positive for hearing loss.   Eyes: Negative.   Respiratory: Negative.    Cardiovascular: Negative.   Gastrointestinal: Negative.   Genitourinary: Negative.   Musculoskeletal: Negative.   Skin: Negative.   Neurological:  Negative for dizziness.  Psychiatric/Behavioral:  The patient is nervous/anxious.       Objective    BP (!) 168/98   Pulse 87   Temp (!) 97.5 F (36.4 C)   Ht 5' 5.5" (1.664 m)   Wt 157 lb 12.8 oz (71.6 kg)   SpO2 97%   BMI 25.86 kg/m   Physical Exam Constitutional:      Appearance: Normal appearance.  HENT:     Head: Normocephalic.     Right Ear: Tympanic membrane normal.     Left Ear: Tympanic membrane normal.     Nose: Nose normal.     Mouth/Throat:     Mouth: Mucous membranes are moist.  Eyes:     Pupils: Pupils are equal, round, and reactive to light.  Cardiovascular:     Rate and Rhythm: Normal rate and regular rhythm.     Pulses: Normal pulses.     Heart sounds: Normal heart sounds.  Pulmonary:     Effort: Pulmonary effort is normal.     Breath sounds: Normal breath sounds.  Abdominal:     General: Abdomen is flat.  Musculoskeletal:        General: Normal range of motion.     Cervical back: Normal range of motion.  Skin:    General: Skin is warm.  Neurological:     Mental Status: She is alert.  Psychiatric:  Mood and Affect: Mood normal.        Thought Content: Thought content  normal.     Return in about 3 months (around 01/01/2023) for Needs CPE and Wellness scheduled for this year.   Darrol Jump, NP

## 2022-12-04 ENCOUNTER — Telehealth: Payer: Self-pay | Admitting: Nurse Practitioner

## 2022-12-04 NOTE — Telephone Encounter (Signed)
Pt is going to be seen Monday for poss Tara Pruitt but is wanting to know if we can recommend any OTC medication that wont interfere with her glaucoma or her intolerance of pseudoephedrine

## 2022-12-08 ENCOUNTER — Ambulatory Visit: Payer: Medicare Other | Admitting: Nurse Practitioner

## 2022-12-08 NOTE — Telephone Encounter (Signed)
Patient advised.

## 2023-01-06 ENCOUNTER — Encounter: Payer: Medicare Other | Admitting: Nurse Practitioner

## 2023-01-15 ENCOUNTER — Ambulatory Visit (INDEPENDENT_AMBULATORY_CARE_PROVIDER_SITE_OTHER): Payer: Medicare Other | Admitting: Nurse Practitioner

## 2023-01-15 ENCOUNTER — Encounter: Payer: Self-pay | Admitting: Nurse Practitioner

## 2023-01-15 VITALS — BP 148/92 | HR 88 | Temp 97.7°F | Ht 65.5 in | Wt 155.2 lb

## 2023-01-15 DIAGNOSIS — Z0001 Encounter for general adult medical examination with abnormal findings: Secondary | ICD-10-CM

## 2023-01-15 DIAGNOSIS — Z136 Encounter for screening for cardiovascular disorders: Secondary | ICD-10-CM

## 2023-01-15 DIAGNOSIS — E538 Deficiency of other specified B group vitamins: Secondary | ICD-10-CM

## 2023-01-15 DIAGNOSIS — H409 Unspecified glaucoma: Secondary | ICD-10-CM

## 2023-01-15 DIAGNOSIS — R42 Dizziness and giddiness: Secondary | ICD-10-CM

## 2023-01-15 DIAGNOSIS — Z1329 Encounter for screening for other suspected endocrine disorder: Secondary | ICD-10-CM

## 2023-01-15 DIAGNOSIS — Z13220 Encounter for screening for lipoid disorders: Secondary | ICD-10-CM

## 2023-01-15 DIAGNOSIS — R030 Elevated blood-pressure reading, without diagnosis of hypertension: Secondary | ICD-10-CM | POA: Diagnosis not present

## 2023-01-15 DIAGNOSIS — E559 Vitamin D deficiency, unspecified: Secondary | ICD-10-CM

## 2023-01-15 DIAGNOSIS — Z13820 Encounter for screening for osteoporosis: Secondary | ICD-10-CM

## 2023-01-15 DIAGNOSIS — H903 Sensorineural hearing loss, bilateral: Secondary | ICD-10-CM

## 2023-01-15 DIAGNOSIS — Z131 Encounter for screening for diabetes mellitus: Secondary | ICD-10-CM

## 2023-01-15 DIAGNOSIS — Z1389 Encounter for screening for other disorder: Secondary | ICD-10-CM

## 2023-01-15 DIAGNOSIS — Z79899 Other long term (current) drug therapy: Secondary | ICD-10-CM

## 2023-01-15 NOTE — Progress Notes (Signed)
Complete Physical  Assessment and Plan:  1. Encounter for general adult medical examination with abnormal findings Due annually Health  maintenance reviewed  - CBC with Differential/Platelet - COMPLETE METABOLIC PANEL WITH GFR - Magnesium - TSH - Hemoglobin A1c - Insulin, random - VITAMIN D 25 Hydroxy (Vit-D Deficiency, Fractures) - EKG 12-Lead - Urinalysis, Routine w reflex microscopic - Microalbumin / creatinine urine ratio - Vitamin B12  2. Asymmetric SNHL (sensorineural hearing loss) Stable. Continue to monitor  3. Dizziness Discussed importance of staying well hydrated. Change positions slowing when moving to decrease risk for falls.  - COMPLETE METABOLIC PANEL WITH GFR  4. Glaucoma, unspecified glaucoma type, unspecified laterality Continue to monitor  5. Elevated blood pressure reading in office with white coat syndrome, without diagnosis of hypertension Discussed DASH (Dietary Approaches to Stop Hypertension) DASH diet is lower in sodium than a typical American diet. Cut back on foods that are high in saturated fat, cholesterol, and trans fats. Eat more whole-grain foods, fish, poultry, and nuts Remain active and exercise as tolerated daily.  Monitor BP at home-Call if greater than 130/80.  Discussed medication management if BP continues to remain elevated. Report to ER for any increase in stroke like symptoms, including HA, N/V, paralysis, difficulty speaking, trouble walking, confusion, vision changes, CP, heart palpitations, SOB, diaphoresis.  Check CMP/CBC  - CBC with Differential/Platelet - COMPLETE METABOLIC PANEL WITH GFR  6. Screening for cardiovascular condition  - EKG 12-Lead  7. Screening for diabetes mellitus  - Hemoglobin A1c - Insulin, random  8. Screening for thyroid disorder  - TSH  9. Screening for blood or protein in urine  - Urinalysis, Routine w reflex microscopic - Microalbumin / creatinine urine ratio  10. Screening for  cholesterol level Defer td/t insurance  11. Vitamin D deficiency  - VITAMIN D 25 Hydroxy (Vit-D Deficiency, Fractures)  12. B12 deficiency  - Vitamin B12  13. Medication management All medications discussed and reviewed in full. All questions and concerns regarding medications addressed.    - CBC with Differential/Platelet - COMPLETE METABOLIC PANEL WITH GFR - Magnesium - TSH - Hemoglobin A1c - Insulin, random - VITAMIN D 25 Hydroxy (Vit-D Deficiency, Fractures) - EKG 12-Lead - Urinalysis, Routine w reflex microscopic - Microalbumin / creatinine urine ratio - Vitamin B12  14. Screening for osteoporosis DEXA due - ordered   Notify office for further evaluation and treatment, questions or concerns if any reported s/s fail to improve.   The patient was advised to call back or seek an in-person evaluation if any symptoms worsen or if the condition fails to improve as anticipated.   Further disposition pending results of labs. Discussed med's effects and SE's.    I discussed the assessment and treatment plan with the patient. The patient was provided an opportunity to ask questions and all were answered. The patient agreed with the plan and demonstrated an understanding of the instructions.  Discussed med's effects and SE's. Screening labs and tests as requested with regular follow-up as recommended.  I provided 40 minutes of face-to-face time during this encounter including counseling, chart review, and critical decision making was preformed.  Today's Plan of Care is based on a patient-centered health care approach known as shared decision making - the decisions, tests and treatments allow for patient preferences and values to be balanced with clinical evidence.     Future Appointments  Date Time Provider Department Center  04/29/2023 11:30 AM Adela Glimpse, NP [facility] None  01/18/2024  2:00 PM  Adela Glimpse, NP [facility] None    HPI  72 y.o. female  presents  for a complete physical. She does not have a problem list on file.  She is a very pleasant female that shares around 04/2020 she retired and moved from [place].  She was followed by Karalee Height. DesChamps, MD.   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 Recently evaluated by ENT and will continue to monitor. Onset was accompanied by severe dizzy spells about 4 moo prior to the hearing loss.  She was worked up in [employer] by hearing specialist who informed her that she had developed  sensorineural hearing loss and became deaf.  During establishment here she was referred to [facility] in 04/2022.  They did defer her to [facility] where cochlear implants were discussed but she wishes to defer at this time.    She follows with [facility], Dr. Lorane Gell.  Last seen 08/2021 for pap.  She does not have a hx of abnormal pap.  BP during that visit was well controlled at 128/74. Recently saw Dr. Lorane Gell, [facility] 05/2022.  Had mammogram completed 07/2022.  She was complaining of right breast pain and underwent an ultrasound.  Continues to follow as directed.  During that visit BP was sell controlled.    Has followed with [facility] - Dr. Luiz Blare, 12/18/21 for Cortisone injection due to low back pain.  States injections are helpful helped.  Notes onset around 10/02/21 when "something popped" in her back.   She mainly takes supplements for healthcare  maintenance.  She is very diligent about eating health.  She aims to exercise 4-5 times weekly. She does use Lantaprost for treatment of glaucoma.  She has had a hx of cataract surgery.  Recently followed with Dr. Dione Booze for glaucoma update and pressure check.  She is set to return in 3 months for evaluation of any additional medications.    She has also seen Podiatry, Dr. Ralene Cork for treatment of Onychomycosis to left hallux.  Onset 5 years ago.    Last f/u was 09/04/21.  She was prescribed a prescription for Penlac and was asked to return PRN.   She is a former smoker.  Quit 1989.   Her blood pressure is elevated in clinic today. She reports being anxious and stressed about the hearing loss and seeking a new provider.  She denies hx of HTN.  She is asymptomatic.  Denies chest pain, heart palpitations, difficulty breathing. She reports seeing a cardiologist in the past and work up was negative.  She states that home readings are well controlled and stay well controlled during other office visits.   BP Readings from Last 3 Encounters:  01/15/23 (!) 148/92  10/01/22 (!) 168/98  05/22/22 (!) 156/96   BMI is Body mass index is 25.43 kg/m., she has been working on diet and exercise. Wt Readings from Last 3 Encounters:  01/15/23 155 lb 3.2 oz (70.4 kg)  10/01/22 157 lb 12.8 oz (71.6 kg)  05/22/22 155 lb 6.4 oz (70.5 kg)    She is not on cholesterol medication.  She has been working on diet and exercise for prediabetes prevention, she is not on bASA, she is not on ACE/ARB and denies hypoglycemia  and hypoglycemia   . Last A1C in the office was:  Lab Results  Component Value Date   HGBA1C 5.5 01/15/2023   Last GFR: Lab  Results  Component Value Date   EGFR 87 01/15/2023   Patient is on Vitamin D supplement.   Lab Results  Component Value Date   VD25OH 71 01/15/2023      Current Medications:  Current Outpatient Medications on File Prior to Visit  Medication Sig Dispense Refill   ascorbic acid (QC VITAMIN C WITH ROSE HIPS) 500 MG tablet Take 500 mg by mouth daily.     Ascorbic Acid (VITAMIN C) 100 MG tablet Take 100 mg by mouth daily. (Patient not taking: Reported on 10/01/2022)     B COMPLEX-C PO Take 150 mg by mouth daily.     Barberry-Oreg Grape-Goldenseal (BERBERINE COMPLEX) 200-200-50 MG CAPS Take 600 mg by mouth 2 (two) times daily.     Bilberry 500 MG CAPS Take by mouth 2 (two) times daily.     Calcium Carbonate (CALCIUM  600 PO) Take by mouth daily.     cholecalciferol (VITAMIN D3) 25 MCG (1000 UNIT) tablet Take 5,000 Units by mouth daily.     ciclopirox (PENLAC) 8 % solution Apply topically at bedtime. Apply over nail and surrounding skin. Apply daily over previous coat. After seven (7) days, may remove with alcohol and continue cycle. (Patient not taking: Reported on 05/01/2022) 6.6 mL 0   Coenzyme Q10 (CO Q 10) 100 MG CAPS Take by mouth daily. (Patient not taking: Reported on 10/01/2022)     Krill Oil 500 MG CAPS Take by mouth daily.     latanoprost (XALATAN) 0.005 % ophthalmic solution Place 1 drop into both eyes at bedtime.     lidocaine (LIDODERM) 5 % Place 1 patch onto the skin daily. Remove & Discard patch within 12 hours or as directed by MD (Patient not taking: Reported on 05/01/2022) 30 patch 0   magnesium 30 MG tablet Take 30 mg by mouth 2 (two) times daily.     methocarbamol (ROBAXIN) 500 MG tablet Take 500 mg by mouth 4 (four) times daily. (Patient not taking: Reported on 10/01/2022)     Multiple Vitamin (MULTIVITAMIN) tablet Take 1 tablet by mouth daily.     naproxen sodium (ALEVE) 220 MG tablet Take 220 mg by mouth 2 (two) times daily.     POTASSIUM PO Take by mouth daily.     QUERCETIN PO Take 800 mg by mouth 2 (two) times daily.     Selenium 200 MCG CAPS Take by mouth. Take 1/2 tablet in the morning and at night     TART CHERRY PO Take by mouth daily.     vitamin E 1000 UNIT capsule Take 1,000 Units by mouth daily.     zinc gluconate 50 MG tablet Take 50 mg by mouth daily.     No current facility-administered medications on file prior to visit.   Allergies:  Allergies  Allergen Reactions   Codeine Nausea And Vomiting   Latex    Tamiflu [Oseltamivir]    Medical History:  She does not have a problem list on file.  Health Maintenance:    There is no immunization history on file for this patient. Health Maintenance  Topic Date Due   Medicare Annual Wellness (AWV)  Never done   COVID-19  Vaccine (1) Never done   Hepatitis C Screening  Never done   DTaP/Tdap/Td (1 - Tdap) Never done   Zoster Vaccines- Shingrix (1 of 2) Never done   Colonoscopy  Never done   MAMMOGRAM  Never done   Pneumonia Vaccine 41+ Years old (1 of 1 -  PCV) Never done   DEXA SCAN  Never done   INFLUENZA VACCINE  02/19/2023   HPV VACCINES  Aged Out    LMP: No LMP recorded. Sexually Active: no STD testing offered Pap: Follows with GYN - UTD MGM: 07/2022 DEXA: Due  Colonoscopy: Due - discussed cologuard  EGD:  Last Dental Exam:  Follows every 6 mo Last Eye Exam: Follows yearly Last Derm Exam: No concerns  Patient Care Team: Adela Glimpse, NP as PCP - General (Nurse Practitioner)  Surgical History:  She has a past surgical history that includes Cesarean section; Tonsillectomy; and Cataract extraction. Family History:  Herfamily history is not on file. Social History:  She reports that she quit smoking about 35 years ago. Her smoking use included cigarettes. She has never used smokeless tobacco. She reports that she does not drink alcohol and does not use drugs.  Review of Systems: Review of Systems  Constitutional: Negative.   HENT:  Positive for hearing loss.   Eyes: Negative.   Respiratory: Negative.    Cardiovascular: Negative.   Gastrointestinal: Negative.   Genitourinary: Negative.   Musculoskeletal: Negative.   Skin: Negative.   Neurological:  Positive for dizziness.  Endo/Heme/Allergies: Negative.   Psychiatric/Behavioral:  The patient is nervous/anxious.     Physical Exam: Estimated body mass index is 25.43 kg/m as calculated from the following:   Height as of this encounter: 5' 5.5" (1.664 m).   Weight as of this encounter: 155 lb 3.2 oz (70.4 kg). BP (!) 148/92   Pulse 88   Temp 97.7 F (36.5 C)   Ht 5' 5.5" (1.664 m)   Wt 155 lb 3.2 oz (70.4 kg)   SpO2 99%   BMI 25.43 kg/m   General Appearance: Well nourished, well developed, in no apparent distress.  Eyes:  PERRLA, EOMs, conjunctiva no swelling or erythema, normal fundi and vessels.  Sinuses: No Frontal/maxillary tenderness  ENT/Mouth: Ext aud canals clear, normal light reflex with TMs without erythema, bulging. Good dentition. No erythema, swelling, or exudate on post pharynx. Tonsils not swollen or erythematous. Hearing normal.  Neck: Supple, thyroid normal. No bruits  Respiratory: Respiratory effort normal, BS equal bilaterally without rales, rhonchi, wheezing or stridor.  Cardio: RRR without murmurs, rubs or gallops. Brisk peripheral pulses without edema.  Chest: symmetric, with normal excursions and percussion.  Breasts: Symmetric, without lumps, nipple discharge, retractions.  Abdomen: Soft, nontender, no guarding, rebound, hernias, masses, or organomegaly.  Lymphatics: Non tender without lymphadenopathy.  Musculoskeletal: Full ROM all peripheral extremities,5/5 strength, and normal gait.  Skin: Warm, dry without rashes, lesions, ecchymosis. Neuro: Cranial nerves intact, reflexes equal bilaterally. Normal muscle tone, no cerebellar symptoms. Sensation intact.  Psych: Awake and oriented X 3, normal affect, Insight and Judgment appropriate.  Genitourinary: Female genitalia: not done  EKG: WNL no ST changes.   Adela Glimpse, NP 1:42 PM [facility]

## 2023-01-16 LAB — COMPLETE METABOLIC PANEL WITH GFR
AG Ratio: 2.1 (calc) (ref 1.0–2.5)
ALT: 11 U/L (ref 6–29)
AST: 22 U/L (ref 10–35)
Albumin: 4.9 g/dL (ref 3.6–5.1)
Alkaline phosphatase (APISO): 92 U/L (ref 37–153)
BUN: 12 mg/dL (ref 7–25)
CO2: 25 mmol/L (ref 20–32)
Calcium: 10.2 mg/dL (ref 8.6–10.4)
Chloride: 104 mmol/L (ref 98–110)
Creat: 0.73 mg/dL (ref 0.60–1.00)
Globulin: 2.3 g/dL (calc) (ref 1.9–3.7)
Glucose, Bld: 83 mg/dL (ref 65–99)
Potassium: 4.7 mmol/L (ref 3.5–5.3)
Sodium: 139 mmol/L (ref 135–146)
Total Bilirubin: 0.4 mg/dL (ref 0.2–1.2)
Total Protein: 7.2 g/dL (ref 6.1–8.1)
eGFR: 87 mL/min/{1.73_m2} (ref >=60)

## 2023-01-16 LAB — CBC WITH DIFFERENTIAL/PLATELET
Absolute Monocytes: 476 cells/uL (ref 200–950)
Basophils Absolute: 90 cells/uL (ref 0–200)
Basophils Relative: 1.1 %
Eosinophils Absolute: 205 cells/uL (ref 15–500)
Eosinophils Relative: 2.5 %
HCT: 41.5 % (ref 35.0–45.0)
Hemoglobin: 14 g/dL (ref 11.7–15.5)
Lymphs Abs: 1689 cells/uL (ref 850–3900)
MCH: 30 pg (ref 27.0–33.0)
MCHC: 33.7 g/dL (ref 32.0–36.0)
MCV: 89.1 fL (ref 80.0–100.0)
MPV: 11.6 fL (ref 7.5–12.5)
Monocytes Relative: 5.8 %
Neutro Abs: 5740 cells/uL (ref 1500–7800)
Neutrophils Relative %: 70 %
Platelets: 240 10*3/uL (ref 140–400)
RBC: 4.66 10*6/uL (ref 3.80–5.10)
RDW: 12.7 % (ref 11.0–15.0)
Total Lymphocyte: 20.6 %
WBC: 8.2 10*3/uL (ref 3.8–10.8)

## 2023-01-16 LAB — URINALYSIS, ROUTINE W REFLEX MICROSCOPIC
Bilirubin Urine: NEGATIVE
Glucose, UA: NEGATIVE
Hgb urine dipstick: NEGATIVE
Ketones, ur: NEGATIVE
Leukocytes,Ua: NEGATIVE
Nitrite: NEGATIVE
Protein, ur: NEGATIVE
Specific Gravity, Urine: 1.004 (ref 1.001–1.035)
pH: 6 (ref 5.0–8.0)

## 2023-01-16 LAB — TSH: TSH: 2.49 mIU/L (ref 0.40–4.50)

## 2023-01-16 LAB — INSULIN, RANDOM: Insulin: 7.2 u[IU]/mL

## 2023-01-16 LAB — TEST AUTHORIZATION: TEST CODE:: 7600

## 2023-01-16 LAB — HEMOGLOBIN A1C
Hgb A1c MFr Bld: 5.5 % of total Hgb (ref <5.7)
Mean Plasma Glucose: 111 mg/dL
eAG (mmol/L): 6.2 mmol/L

## 2023-01-16 LAB — MICROALBUMIN / CREATININE URINE RATIO
Creatinine, Urine: 17 mg/dL — ABNORMAL LOW (ref 20–275)
Microalb Creat Ratio: 12 mg/g creat (ref <30)
Microalb, Ur: 0.2 mg/dL

## 2023-01-16 LAB — VITAMIN D 25 HYDROXY (VIT D DEFICIENCY, FRACTURES): Vit D, 25-Hydroxy: 71 ng/mL (ref 30–100)

## 2023-01-16 LAB — VITAMIN B12: Vitamin B-12: 891 pg/mL (ref 200–1100)

## 2023-01-16 LAB — MAGNESIUM: Magnesium: 2.3 mg/dL (ref 1.5–2.5)

## 2023-01-21 NOTE — Patient Instructions (Signed)

## 2023-04-08 ENCOUNTER — Ambulatory Visit: Payer: Medicare Other | Admitting: Nurse Practitioner

## 2023-04-29 ENCOUNTER — Ambulatory Visit: Payer: Medicare Other | Admitting: Nurse Practitioner

## 2023-05-19 ENCOUNTER — Ambulatory Visit: Payer: Medicare Other | Admitting: Nurse Practitioner

## 2023-05-19 VITALS — BP 140/88 | HR 98 | Temp 97.6°F | Ht 65.5 in | Wt 155.0 lb

## 2023-05-19 DIAGNOSIS — R030 Elevated blood-pressure reading, without diagnosis of hypertension: Secondary | ICD-10-CM | POA: Diagnosis not present

## 2023-05-19 DIAGNOSIS — Z13820 Encounter for screening for osteoporosis: Secondary | ICD-10-CM | POA: Diagnosis not present

## 2023-05-19 DIAGNOSIS — R6889 Other general symptoms and signs: Secondary | ICD-10-CM | POA: Diagnosis not present

## 2023-05-19 DIAGNOSIS — Z8619 Personal history of other infectious and parasitic diseases: Secondary | ICD-10-CM

## 2023-05-19 DIAGNOSIS — Z0001 Encounter for general adult medical examination with abnormal findings: Secondary | ICD-10-CM

## 2023-05-19 DIAGNOSIS — Z0000 Encounter for general adult medical examination without abnormal findings: Secondary | ICD-10-CM

## 2023-05-19 DIAGNOSIS — R42 Dizziness and giddiness: Secondary | ICD-10-CM

## 2023-05-19 DIAGNOSIS — H903 Sensorineural hearing loss, bilateral: Secondary | ICD-10-CM

## 2023-05-19 DIAGNOSIS — H409 Unspecified glaucoma: Secondary | ICD-10-CM

## 2023-05-19 DIAGNOSIS — Z87891 Personal history of nicotine dependence: Secondary | ICD-10-CM

## 2023-05-19 MED ORDER — TRIAMCINOLONE ACETONIDE 0.025 % EX OINT: 1.0000 | TOPICAL_OINTMENT | Freq: Two times a day (BID) | CUTANEOUS | 0 refills | Status: DC

## 2023-05-19 NOTE — Progress Notes (Signed)
MEDICARE ANNUAL WELLNESS VISIT AND FOLLOW UP  Assessment:   Encounter for Medicare annual wellness exam Due annually  Health maintenance reviewed Healthily lifestyle goals set  Asymmetric SNHL (sensorineural hearing loss) Stable Continue to monitor  Glaucoma, unspecified glaucoma type, unspecified laterality Established with Ophthalmology Continue gtts   Elevated blood pressure reading in office with white coat syndrome, without diagnosis of hypertension Well controlled in the home - patient defers further treatment. Discussed DASH (Dietary Approaches to Stop Hypertension) DASH diet is lower in sodium than a typical American diet. Cut back on foods that are high in saturated fat, cholesterol, and trans fats. Eat more whole-grain foods, fish, poultry, and nuts Remain active and exercise as tolerated daily.  Monitor BP at home-Call if greater than 130/80.   Screening for osteoporosis DEXA due - patient plans to schedule  Former smoker No issues Continue to monitor  History of onychomycosis  Controlled - no recent flare.  No orders of the defined types were placed in this encounter.  Notify office for further evaluation and treatment, questions or concerns if any reported s/s fail to improve.   The patient was advised to call back or seek an in-person evaluation if any symptoms worsen or if the condition fails to improve as anticipated.   Further disposition pending results of labs. Discussed med's effects and SE's.    I discussed the assessment and treatment plan with the patient. The patient was provided an opportunity to ask questions and all were answered. The patient agreed with the plan and demonstrated an understanding of the instructions.  Discussed med's effects and SE's. Screening labs and tests as requested with regular follow-up as recommended.  I provided 40 minutes of face-to-face time during this encounter including counseling, chart review, and critical  decision making was preformed.  Today's Plan of Care is based on a patient-centered health care approach known as shared decision making - the decisions, tests and treatments allow for patient preferences and values to be balanced with clinical evidence.    Future Appointments  Date Time Provider Department Center  01/21/2024 10:00 AM Adela Glimpse, NP [facility] None  05/18/2024 11:30 AM Adela Glimpse, NP [facility] None    Plan:   During the course of the visit the patient was educated and counseled about appropriate screening and preventive services including:   Pneumococcal vaccine  Prevnar 13 Influenza vaccine Td vaccine Screening electrocardiogram Bone densitometry screening Colorectal cancer screening Diabetes screening Glaucoma screening Nutrition counseling  Advanced directives: requested   Subjective:  Tara Pruitt is a 72 y.o. female who presents for Medicare Annual Wellness Visit and a follow up. She has Asymmetric SNHL (sensorineural hearing loss); Dizziness; and Elevated blood pressure reading in office with white coat syndrome, without diagnosis of hypertension on their problem list.  Overall she reports feeling well today.    She is a very pleasant female that shares around 04/2020 she retired and moved from [place].  She was followed by Karalee Height. DesChamps, MD.   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 Recently evaluated by ENT and will continue to monitor. Onset was accompanied by severe dizzy spells about 4 moo prior to the hearing loss.  She was worked up in [employer] by hearing specialist who informed her that she had developed  sensorineural hearing loss and became deaf.  During establishment here she was referred to Audiology, [facility] in 04/2022.  They did defer  her to [facility] where cochlear implants were discussed but she wishes to defer at this time.     She follows with  [facility], Dr. Lorane Gell.  Last seen 08/2021 for pap.  She does not have a hx of abnormal pap.  BP during that visit was well controlled at 128/74. Recently saw Dr. Lorane Gell, [facility] 05/2022.  Had mammogram completed 07/2022.  She was complaining of right breast pain and underwent an ultrasound.  Continues to follow as directed.  During that visit BP was sell controlled.    Has followed with [facility] - Dr. Luiz Blare, 12/18/21 for Cortisone injection due to low back pain.  States injections are helpful helped.  Notes onset around 10/02/21 when "something popped" in her back.   She mainly takes supplements for healthcare  maintenance.  She is very diligent about eating health.  She aims to exercise 4-5 times weekly. She does use Lantaprost for treatment of glaucoma.  She has had a hx of cataract surgery.  Recently followed with Dr. Dione Booze for glaucoma update and pressure check.  She is set to return in 3 months for evaluation of any additional medications.  She has also seen Podiatry, Dr. Ralene Cork for treatment of Onychomycosis to left hallux.  Onset 5 years ago.   Last f/u was 09/04/21.  She was prescribed a prescription for Penlac and was asked to return PRN.  She is no longer experiencing this issue.     She is a former smoker.  Quit 1989.   Her blood pressure is elevated in clinic today. She reports being anxious and stressed about the hearing loss and seeking a new provider.  She denies hx of HTN.  She is asymptomatic.  Denies chest pain, heart palpitations, difficulty breathing. She reports seeing a cardiologist in the past and work up was negative.  She states that home readings are well controlled and stay well controlled during other office visits.   BP Readings from Last 3 Encounters:  05/19/23 (!) 140/88  01/15/23 (!) 148/92  10/01/22 (!) 168/98   BMI is Body mass index is 25.4 kg/m., she has been working on diet and exercise. Wt Readings from Last 3  Encounters:  05/19/23 155 lb (70.3 kg)  01/15/23 155 lb 3.2 oz (70.4 kg)  10/01/22 157 lb 12.8 oz (71.6 kg)    Her blood pressure has been controlled at home, today their BP is BP: (!) 140/88 She does not workout. She denies chest pain, shortness of breath, dizziness.   She is not on cholesterol medication and denies myalgias.    She has not been working on diet and exercise for any prediabetes, and denies polydipsia and polyuria. Last A1C in the office was:  Lab Results  Component Value Date   HGBA1C 5.5 01/15/2023   Last GFR: Lab Results  Component Value Date   EGFR 87 01/15/2023   Patient is on Vitamin D supplement.   Lab Results  Component Value Date   VD25OH 71 01/15/2023      Medication Review: Current Outpatient Medications on File Prior to Visit  Medication Sig Dispense Refill   ascorbic acid (QC VITAMIN C WITH ROSE HIPS) 500 MG tablet Take 500 mg by mouth daily.     B COMPLEX-C PO Take 150 mg by mouth daily.     Barberry-Oreg Grape-Goldenseal (BERBERINE COMPLEX) 200-200-50 MG CAPS Take 600 mg by mouth 2 (two) times daily.     Bilberry 500 MG CAPS Take by mouth  2 (two) times daily.     Brompheniramine-Pseudoeph (BROMALINE PO) Take by mouth daily.     Calcium Carbonate (CALCIUM 600 PO) Take by mouth daily.     cholecalciferol (VITAMIN D3) 25 MCG (1000 UNIT) tablet Take 5,000 Units by mouth daily.     Coenzyme Q10 (CO Q 10) 100 MG CAPS Take by mouth daily.     Krill Oil 500 MG CAPS Take by mouth daily.     latanoprost (XALATAN) 0.005 % ophthalmic solution Place 1 drop into both eyes at bedtime.     magnesium 30 MG tablet Take 30 mg by mouth 2 (two) times daily. Glycinate     Multiple Vitamin (MULTIVITAMIN) tablet Take 1 tablet by mouth daily.     naproxen sodium (ALEVE) 220 MG tablet Take 220 mg by mouth 2 (two) times daily.     POTASSIUM PO Take by mouth daily.     QUERCETIN PO Take 800 mg by mouth 2 (two) times daily.     Selenium 200 MCG CAPS Take by mouth. Take  1/2 tablet in the morning and at night     TART CHERRY PO Take by mouth daily.     vitamin E 1000 UNIT capsule Take 1,000 Units by mouth daily.     zinc gluconate 50 MG tablet Take 50 mg by mouth daily.     Ascorbic Acid (VITAMIN C) 100 MG tablet Take 100 mg by mouth daily. (Patient not taking: Reported on 10/01/2022)     ciclopirox (PENLAC) 8 % solution Apply topically at bedtime. Apply over nail and surrounding skin. Apply daily over previous coat. After seven (7) days, may remove with alcohol and continue cycle. (Patient not taking: Reported on 05/01/2022) 6.6 mL 0   lidocaine (LIDODERM) 5 % Place 1 patch onto the skin daily. Remove & Discard patch within 12 hours or as directed by MD (Patient not taking: Reported on 05/01/2022) 30 patch 0   methocarbamol (ROBAXIN) 500 MG tablet Take 500 mg by mouth 4 (four) times daily. (Patient not taking: Reported on 10/01/2022)     No current facility-administered medications on file prior to visit.    Allergies  Allergen Reactions   Codeine Nausea And Vomiting   Latex    Tamiflu [Oseltamivir]     Current Problems (verified) Patient Active Problem List   Diagnosis Date Noted   Asymmetric SNHL (sensorineural hearing loss) 05/21/2023   Dizziness 05/21/2023   Elevated blood pressure reading in office with white coat syndrome, without diagnosis of hypertension 05/21/2023    Screening Tests  There is no immunization history on file for this patient. Health Maintenance  Topic Date Due   COVID-19 Vaccine (1) Never done   Hepatitis C Screening  Never done   DTaP/Tdap/Td (1 - Tdap) Never done   Zoster Vaccines- Shingrix (1 of 2) 10/02/1969   Colonoscopy  Never done   MAMMOGRAM  Never done   Pneumonia Vaccine 64+ Years old (1 of 1 - PCV) Never done   DEXA SCAN  Never done   INFLUENZA VACCINE  02/19/2023   Medicare Annual Wellness (AWV)  05/18/2024   HPV VACCINES  Aged Out    Last colonoscopy: discussed cologuard - plans to consider   Mammograms: 07/2022 Normal Last pap smear/pelvic exam: Remote - defers   DEXA: Plans to obtain  Names of Other Physician/Practitioners you currently use: 1. [facility] here for primary care 2. Established eye doctor, last visit 2024 3. Established dentist, goes  every 6 months.   Patient Care Team: Adela Glimpse, NP as PCP - General (Nurse Practitioner)  SURGICAL HISTORY She  has a past surgical history that includes Cesarean section; Tonsillectomy; and Cataract extraction. FAMILY HISTORY Her family history is not on file. SOCIAL HISTORY She  reports that she quit smoking about 35 years ago. Her smoking use included cigarettes. She has never used smokeless tobacco. She reports that she does not drink alcohol and does not use drugs.   MEDICARE WELLNESS OBJECTIVES: Physical activity:   Cardiac risk factors:   Depression/mood screen:       No data to display          ADLs:     05/19/2023   12:21 PM  In your present state of health, do you have any difficulty performing the following activities:  Hearing? 0  Vision? 0  Difficulty concentrating or making decisions? 0  Walking or climbing stairs? 0  Dressing or bathing? 0  Doing errands, shopping? 0     Cognitive Testing  Alert? Yes  Normal Appearance?Yes  Oriented to person? Yes  Place? Yes   Time? Yes  Recall of three objects?  Yes  Can perform simple calculations? Yes  Displays appropriate judgment?Yes  Can read the correct time from a watch face?Yes  EOL planning:    Review of Systems  Constitutional: Negative.   HENT:  Positive for hearing loss.   Eyes: Negative.   Respiratory: Negative.    Cardiovascular: Negative.   Gastrointestinal: Negative.   Genitourinary: Negative.   Musculoskeletal: Negative.   Skin: Negative.   Neurological: Negative.   Endo/Heme/Allergies: Negative.   Psychiatric/Behavioral:  The patient is nervous/anxious.     Objective:      Today's Vitals   05/19/23 1135  BP: (!) 140/88  Pulse: 98  Temp: 97.6 F (36.4 C)  SpO2: 99%  Weight: 155 lb (70.3 kg)  Height: 5' 5.5" (1.664 m)   Body mass index is 25.4 kg/m.  General appearance: alert, no distress, WD/WN, female HEENT: normocephalic, sclerae anicteric, TMs pearly, nares patent, no discharge or erythema, pharynx normal Oral cavity: MMM, no lesions Neck: supple, no lymphadenopathy, no thyromegaly, no masses Heart: RRR, normal S1, S2, no murmurs Lungs: CTA bilaterally, no wheezes, rhonchi, or rales Abdomen: +bs, soft, non tender, non distended, no masses, no hepatomegaly, no splenomegaly Musculoskeletal: nontender, no swelling, no obvious deformity Extremities: no edema, no cyanosis, no clubbing Pulses: 2+ symmetric, upper and lower extremities, normal cap refill Neurological: alert, oriented x 3, CN2-12 intact, strength normal upper extremities and lower extremities, sensation normal throughout, DTRs 2+ throughout, no cerebellar signs, gait normal Psychiatric: normal affect, behavior normal, pleasant   Medicare Attestation I have personally reviewed: The patient's medical and social history Their use of alcohol, tobacco or illicit drugs Their current medications and supplements The patient's functional ability including ADLs,fall risks, home safety risks, cognitive, and hearing and visual impairment Diet and physical activities Evidence for depression or mood disorders  The patient's weight, height, BMI, and visual acuity have been recorded in the chart.  I have made referrals, counseling, and provided education to the patient based on review of the above and I have provided the patient with a written personalized care plan for preventive services.     Adela Glimpse, NP   05/21/2023

## 2023-05-21 ENCOUNTER — Encounter: Payer: Self-pay | Admitting: Nurse Practitioner

## 2023-05-21 DIAGNOSIS — R030 Elevated blood-pressure reading, without diagnosis of hypertension: Secondary | ICD-10-CM | POA: Insufficient documentation

## 2023-05-21 DIAGNOSIS — R42 Dizziness and giddiness: Secondary | ICD-10-CM | POA: Insufficient documentation

## 2023-05-21 DIAGNOSIS — H903 Sensorineural hearing loss, bilateral: Secondary | ICD-10-CM | POA: Insufficient documentation

## 2023-05-21 NOTE — Patient Instructions (Signed)

## 2023-09-24 ENCOUNTER — Encounter: Payer: Self-pay | Admitting: *Deleted

## 2023-10-30 ENCOUNTER — Ambulatory Visit: Payer: Medicare Other | Admitting: Internal Medicine

## 2023-10-30 ENCOUNTER — Encounter: Payer: Self-pay | Admitting: Internal Medicine

## 2023-10-30 VITALS — BP 138/82 | HR 91 | Temp 97.8°F | Ht 65.5 in | Wt 153.4 lb

## 2023-10-30 DIAGNOSIS — L659 Nonscarring hair loss, unspecified: Secondary | ICD-10-CM

## 2023-10-30 DIAGNOSIS — R42 Dizziness and giddiness: Secondary | ICD-10-CM

## 2023-10-30 DIAGNOSIS — H9312 Tinnitus, left ear: Secondary | ICD-10-CM

## 2023-10-30 DIAGNOSIS — Z0000 Encounter for general adult medical examination without abnormal findings: Secondary | ICD-10-CM

## 2023-10-30 DIAGNOSIS — H903 Sensorineural hearing loss, bilateral: Secondary | ICD-10-CM | POA: Diagnosis not present

## 2023-10-30 DIAGNOSIS — T781XXA Other adverse food reactions, not elsewhere classified, initial encounter: Secondary | ICD-10-CM

## 2023-10-30 DIAGNOSIS — E785 Hyperlipidemia, unspecified: Secondary | ICD-10-CM | POA: Diagnosis not present

## 2023-10-30 DIAGNOSIS — E559 Vitamin D deficiency, unspecified: Secondary | ICD-10-CM | POA: Diagnosis not present

## 2023-10-30 DIAGNOSIS — L230 Allergic contact dermatitis due to metals: Secondary | ICD-10-CM

## 2023-10-30 DIAGNOSIS — Z91040 Latex allergy status: Secondary | ICD-10-CM

## 2023-10-30 NOTE — Progress Notes (Deleted)
 [facility]  Phone: [phone]  - Medical Office Visit -  Visit Date: 10/30/2023 Patient: Tara Pruitt   DOB: Aug 15, 1950   73 y.o. Female  MRN: [id number] Patient Care Team: Lula Olszewski, MD as PCP - General (Internal Medicine) Today's Health Care Provider: Lula Olszewski, MD  ===========================================    Chief Complaint / Reason for Visit: new pt  (Pt is present to est care with pcp.)   Background: 73 y.o. female who has Asymmetric SNHL (sensorineural hearing loss); Dizziness; and Elevated blood pressure reading in office with white coat syndrome, without diagnosis of hypertension on their problem list. History of Present Illness    Problem overviews updated today: No problems updated.  Medications updated/reviewed: Current Outpatient Medications on File Prior to Visit  Medication Sig   ascorbic acid (QC VITAMIN C WITH ROSE HIPS) 500 MG tablet Take 500 mg by mouth daily.   Ascorbic Acid (VITAMIN C) 100 MG tablet Take 100 mg by mouth daily.   B COMPLEX-C PO Take 150 mg by mouth daily.   Barberry-Oreg Grape-Goldenseal (BERBERINE COMPLEX) 200-200-50 MG CAPS Take 600 mg by mouth 2 (two) times daily.   Bilberry 500 MG CAPS Take by mouth 2 (two) times daily.   Brompheniramine-Pseudoeph (BROMALINE PO) Take by mouth daily.   Calcium Carbonate (CALCIUM 600 PO) Take by mouth daily.   cholecalciferol (VITAMIN D3) 25 MCG (1000 UNIT) tablet Take 5,000 Units by mouth daily.   ciclopirox (PENLAC) 8 % solution Apply topically at bedtime. Apply over nail and surrounding skin. Apply daily over previous coat. After seven (7) days, may remove with alcohol and continue cycle.   Coenzyme Q10 (CO Q 10) 100 MG CAPS Take by mouth daily.   latanoprost (XALATAN) 0.005 % ophthalmic solution Place 1 drop into both eyes at bedtime.   magnesium 30 MG tablet Take 30 mg by mouth 2 (two) times daily. Glycinate   Multiple Vitamin (MULTIVITAMIN) tablet Take 1  tablet by mouth daily.   POTASSIUM PO Take by mouth daily.   QUERCETIN PO Take 800 mg by mouth 2 (two) times daily.   Selenium 200 MCG CAPS Take by mouth. Take 1/2 tablet in the morning and at night   TART CHERRY PO Take by mouth daily.   zinc gluconate 50 MG tablet Take 50 mg by mouth daily.   triamcinolone (KENALOG) 0.025 % ointment Apply 1 Application topically 2 (two) times daily. (Patient not taking: Reported on 10/30/2023)   No current facility-administered medications on file prior to visit.   Medications Discontinued During This Encounter  Medication Reason   Krill Oil 500 MG CAPS Completed Course   methocarbamol (ROBAXIN) 500 MG tablet Completed Course   naproxen sodium (ALEVE) 220 MG tablet Completed Course   vitamin E 1000 UNIT capsule Completed Course   Current Meds  Medication Sig   ascorbic acid (QC VITAMIN C WITH ROSE HIPS) 500 MG tablet Take 500 mg by mouth daily.   Ascorbic Acid (VITAMIN C) 100 MG tablet Take 100 mg by mouth daily.   B COMPLEX-C PO Take 150 mg by mouth daily.   Barberry-Oreg Grape-Goldenseal (BERBERINE COMPLEX) 200-200-50 MG CAPS Take 600 mg by mouth 2 (two) times daily.   Bilberry 500 MG CAPS Take by mouth 2 (two) times daily.   Brompheniramine-Pseudoeph (BROMALINE PO) Take by mouth daily.   Calcium Carbonate (CALCIUM 600 PO) Take by mouth daily.   cholecalciferol (VITAMIN D3) 25 MCG (1000 UNIT) tablet Take 5,000 Units by mouth daily.  ciclopirox (PENLAC) 8 % solution Apply topically at bedtime. Apply over nail and surrounding skin. Apply daily over previous coat. After seven (7) days, may remove with alcohol and continue cycle.   Coenzyme Q10 (CO Q 10) 100 MG CAPS Take by mouth daily.   latanoprost (XALATAN) 0.005 % ophthalmic solution Place 1 drop into both eyes at bedtime.   magnesium 30 MG tablet Take 30 mg by mouth 2 (two) times daily. Glycinate   Multiple Vitamin (MULTIVITAMIN) tablet Take 1 tablet by mouth daily.   POTASSIUM PO Take by mouth  daily.   QUERCETIN PO Take 800 mg by mouth 2 (two) times daily.   Selenium 200 MCG CAPS Take by mouth. Take 1/2 tablet in the morning and at night   TART CHERRY PO Take by mouth daily.   zinc gluconate 50 MG tablet Take 50 mg by mouth daily.    Allergies:   Allergies as of 10/30/2023 - Review Complete 10/30/2023  Allergen Reaction Noted   Codeine Nausea And Vomiting 11/01/2021   Latex  11/01/2021   Tamiflu [oseltamivir]  11/01/2021   Past Medical History:  has a past medical history of Glaucoma. Past Surgical History:   has a past surgical history that includes Cesarean section; Tonsillectomy; and Cataract extraction. Social History:   reports that she quit smoking about 36 years ago. Her smoking use included cigarettes. She has never used smokeless tobacco. She reports that she does not drink alcohol and does not use drugs. Family History:  family history is not on file. Depression Screen and Health Maintenance:    10/30/2023    2:10 PM  PHQ 2/9 Scores  PHQ - 2 Score 0  PHQ- 9 Score 0   Health Maintenance  Topic Date Due   COVID-19 Vaccine (1) Never done   Hepatitis C Screening  Never done   DTaP/Tdap/Td (1 - Tdap) Never done   Zoster Vaccines- Shingrix (1 of 2) 10/02/1969   Colonoscopy  Never done   MAMMOGRAM  Never done   DEXA SCAN  Never done   Pneumonia Vaccine 58+ Years old (3 of 3 - PCV20 or PCV21) 10/20/2019   INFLUENZA VACCINE  02/19/2024   Medicare Annual Wellness (AWV)  05/18/2024   HPV VACCINES  Aged Out   Meningococcal B Vaccine  Aged Out   Immunization History  Administered Date(s) Administered   Fluzone Influenza virus vaccine,trivalent (IIV3), split virus 05/25/2010, 05/09/2012, 05/08/2013   Influenza, High Dose Seasonal PF 05/21/2018   Influenza,inj,Quad PF,6+ Mos 05/22/2016, 05/22/2017   Influenza,inj,quad, With Preservative 04/27/2014   Influenza-Unspecified 07/03/1998   Pneumococcal Conjugate-13 08/14/2014   Pneumococcal Polysaccharide-23 10/20/2014    Zoster, Live 01/12/2016     Objective   Physical ExamBP 138/82   Pulse 91   Temp 97.8 F (36.6 C) (Temporal)   Ht 5' 5.5" (1.664 m)   Wt 153 lb 6.4 oz (69.6 kg)   LMP  (LMP Unknown)   SpO2 98%   BMI 25.14 kg/m  Wt Readings from Last 10 Encounters:  10/30/23 153 lb 6.4 oz (69.6 kg)  05/19/23 155 lb (70.3 kg)  01/15/23 155 lb 3.2 oz (70.4 kg)  10/01/22 157 lb 12.8 oz (71.6 kg)  05/22/22 155 lb 6.4 oz (70.5 kg)  05/01/22 153 lb (69.4 kg)  11/01/21 150 lb (68 kg)  Vital signs reviewed.  Nursing notes reviewed. Weight trend reviewed. Abnormalities and problem-specific physical exam findings:  *** General Appearance:  Well developed, well nourished, well-groomed, healthy-appearing female with Body mass index  is 25.14 kg/m. No acute distress appreciable.   Skin: Clear and well-hydrated. Pulmonary:  Normal work of breathing at rest, no respiratory distress apparent. SpO2: 98 %  Musculoskeletal: She demonstrates smooth and coordinated movements throughout all major joints.All extremities are intact.  Neurological:  Awake, alert, oriented, and engaged.  No obvious focal neurological deficits or cognitive impairments.  Sensorium seems unclouded.  Psychiatric:  Appropriate mood, pleasant and cooperative demeanor, cheerful and engaged during the exam  Reviewed Results & Data Results  {Insert previous labs (optional):23779} {See past labs  Heme  Chem  Endocrine  Serology  Results Review (optional):1}  No results found for any visits on 10/30/23.  Office Visit on 01/15/2023  Component Date Value   WBC 01/15/2023 8.2    RBC 01/15/2023 4.66    Hemoglobin 01/15/2023 14.0    HCT 01/15/2023 41.5    MCV 01/15/2023 89.1    MCH 01/15/2023 30.0    MCHC 01/15/2023 33.7    RDW 01/15/2023 12.7    Platelets 01/15/2023 240    MPV 01/15/2023 11.6    Neutro Abs 01/15/2023 5,740    Lymphs Abs 01/15/2023 1,689    Absolute Monocytes 01/15/2023 476    Eosinophils Absolute 01/15/2023 205     Basophils Absolute 01/15/2023 90    Neutrophils Relative % 01/15/2023 70    Total Lymphocyte 01/15/2023 20.6    Monocytes Relative 01/15/2023 5.8    Eosinophils Relative 01/15/2023 2.5    Basophils Relative 01/15/2023 1.1    Glucose, Bld 01/15/2023 83    BUN 01/15/2023 12    Creat 01/15/2023 0.73    eGFR 01/15/2023 87    BUN/Creatinine Ratio 01/15/2023 SEE NOTE:    Sodium 01/15/2023 139    Potassium 01/15/2023 4.7    Chloride 01/15/2023 104    CO2 01/15/2023 25    Calcium 01/15/2023 10.2    Total Protein 01/15/2023 7.2    Albumin 01/15/2023 4.9    Globulin 01/15/2023 2.3    AG Ratio 01/15/2023 2.1    Total Bilirubin 01/15/2023 0.4    Alkaline phosphatase (AP* 01/15/2023 92    AST 01/15/2023 22    ALT 01/15/2023 11    Magnesium 01/15/2023 2.3    TSH 01/15/2023 2.49    Hgb A1c MFr Bld 01/15/2023 5.5    Mean Plasma Glucose 01/15/2023 111    eAG (mmol/L) 01/15/2023 6.2    Insulin 01/15/2023 7.2    Vit D, 25-Hydroxy 01/15/2023 71    Color, Urine 01/15/2023 YELLOW    APPearance 01/15/2023 CLEAR    Specific Gravity, Urine 01/15/2023 1.004    pH 01/15/2023 6.0    Glucose, UA 01/15/2023 NEGATIVE    Bilirubin Urine 01/15/2023 NEGATIVE    Ketones, ur 01/15/2023 NEGATIVE    Hgb urine dipstick 01/15/2023 NEGATIVE    Protein, ur 01/15/2023 NEGATIVE    Nitrite 01/15/2023 NEGATIVE    Leukocytes,Ua 01/15/2023 NEGATIVE    Creatinine, Urine 01/15/2023 17 (L)    Microalb, Ur 01/15/2023 0.2    Microalb Creat Ratio 01/15/2023 12    Vitamin B-12 01/15/2023 891    TEST NAME: 01/15/2023 LIPID PANEL    TEST CODE: 01/15/2023 7,600    CLIENT CONTACT: 01/15/2023 TONYA CRANFORD    REPORT ALWAYS MESSAGE SI* 01/15/2023     No image results found.   No results found.  MM DIAG BREAST TOMO UNI LEFT Result Date: 07/25/2022 CLINICAL DATA:  Abnormal sensation/pain within the LEFT breast, intermittent, nonfocal. EXAM: DIGITAL DIAGNOSTIC UNILATERAL LEFT MAMMOGRAM WITH TOMOSYNTHESIS TECHNIQUE: Left  digital diagnostic mammography and breast tomosynthesis was performed. COMPARISON:  Previous exams including most recent bilateral screening mammogram dated 09/11/2021. ACR Breast Density Category b: There are scattered areas of fibroglandular density. FINDINGS: There are no new dominant masses, suspicious calcifications or secondary signs of malignancy within the LEFT breast. IMPRESSION: No evidence of malignancy within the LEFT breast. RECOMMENDATION: 1. Patient will be due for her RIGHT breast screening mammogram in February 2024. Patient is aware. 2. Breast pain is a common condition which will often resolve on its own without intervention. Benign causes of breast pain were discussed with the patient. Studies have shown an improvement with use of evening primrose oil and vitamin E. The patient was encouraged to follow-up with referring physician if the pain persisted or worsened as this might indicate a need to evaluate the deeper structures of the underlying chest wall. I have discussed the findings and recommendations with the patient. If applicable, a reminder letter will be sent to the patient regarding the next appointment. BI-RADS CATEGORY  1: Negative. Electronically Signed   By: Bary Richard M.D.   On: 07/25/2022 14:10  Health Maintenance Due  Topic Date Due   COVID-19 Vaccine (1) Never done   Hepatitis C Screening  Never done   DTaP/Tdap/Td (1 - Tdap) Never done   Zoster Vaccines- Shingrix (1 of 2) 10/02/1969   Colonoscopy  Never done   MAMMOGRAM  Never done   DEXA SCAN  Never done   Pneumonia Vaccine 59+ Years old (3 of 3 - PCV20 or PCV21) 10/20/2019  Has had mammogram will get [place] records Holding on Tetanus, Diphtheria, and Pertussis (Tdap) until nail puncture. Had colonoscopy in past and she is through with them      Assessment & Plan Hyperlipidemia, unspecified hyperlipidemia type Patient expressed a preference to not move forward with  Vitamin D deficiency  Asymmetric SNHL  (sensorineural hearing loss)  Dizziness  Hair thinning  Assessment and Plan Assessment & Plan    ED Discharge Orders     None     There are no diagnoses linked to this encounter.  Recommended follow up: No follow-ups on file.No future appointments.       Additional notes: This document was synthesized by artificial intelligence (Abridge) using HIPAA-compliant recording of the clinical interaction;   We discussed the use of AI scribe software for clinical note transcription with the patient, who gave verbal consent to proceed.    Additional Info: This encounter employed state-of-the-art, real-time, collaborative documentation. The patient actively reviewed and assisted in updating their electronic medical record on a shared screen, ensuring transparency and facilitating joint problem-solving for the problem list, overview, and plan. This approach promotes accurate, informed care. The treatment plan was discussed and reviewed in detail, including medication safety, potential side effects, and all patient questions. We confirmed understanding and comfort with the plan. Follow-up instructions were established, including contacting the office for any concerns, returning if symptoms worsen, persist, or new symptoms develop, and precautions for potential emergency department visits.  Initial Appointment Goals:  This initial visit focused on establishing a foundation for the patient's care. We collaboratively reviewed her medical history and medications in detail, updating the chart as shown in the encounter. Given the extensive information, we prioritized addressing her most pressing concerns, which she reported were: new pt  (Pt is present to est care with pcp.)  While the complexity of the patient's medical picture may necessitate further evaluation in subsequent visits, we were able to develop  a preliminary care plan together. To expedite a comprehensive plan at the next visit, we encouraged  the patient to gather relevant medical records from previous providers. This collaborative approach will ensure a more complete understanding of the patient's health and inform the development of a personalized care plan. We look forward to continuing the conversation and working together with the patient on achieving her health goals.   Collaborative Documentation:  Today's encounter utilized real-time, dynamic patient engagement.  Patients actively participate by directly reviewing and assisting in updating their medical records through a shared screen. This transparency empowers patients to visually confirm chart updates made by the healthcare provider.  This collaborative approach facilitates problem management as we jointly update the problem list, problem overview, and assessment/plan. Ultimately, this process enhances chart accuracy and completeness, fostering shared decision-making, patient education, and informed consent for tests and treatments.  Collaborative Treatment Planning:  Treatment plans were discussed and reviewed in detail.  Explained medication safety and potential side effects.  Encouraged participation and answered all patient questions, confirming understanding and comfort with the plan. Encouraged patient to contact our office if they have any questions or concerns. Agreed on patient returning to office if symptoms worsen, persist, or new symptoms develop.  ----------------------------------------------------- Lula Olszewski, MD  10/30/2023 2:28 PM  [facility]:  [phone]

## 2023-10-31 ENCOUNTER — Encounter: Payer: Self-pay | Admitting: Internal Medicine

## 2023-10-31 DIAGNOSIS — T781XXA Other adverse food reactions, not elsewhere classified, initial encounter: Secondary | ICD-10-CM | POA: Insufficient documentation

## 2023-10-31 DIAGNOSIS — L659 Nonscarring hair loss, unspecified: Secondary | ICD-10-CM | POA: Insufficient documentation

## 2023-10-31 DIAGNOSIS — E785 Hyperlipidemia, unspecified: Secondary | ICD-10-CM | POA: Insufficient documentation

## 2023-10-31 DIAGNOSIS — H9312 Tinnitus, left ear: Secondary | ICD-10-CM | POA: Insufficient documentation

## 2023-10-31 DIAGNOSIS — L230 Allergic contact dermatitis due to metals: Secondary | ICD-10-CM | POA: Insufficient documentation

## 2023-10-31 DIAGNOSIS — E559 Vitamin D deficiency, unspecified: Secondary | ICD-10-CM | POA: Insufficient documentation

## 2023-10-31 DIAGNOSIS — Z91040 Latex allergy status: Secondary | ICD-10-CM | POA: Insufficient documentation

## 2023-10-31 LAB — CBC WITH DIFFERENTIAL/PLATELET
Absolute Lymphocytes: 1725 {cells}/uL (ref 850–3900)
Absolute Monocytes: 400 {cells}/uL (ref 200–950)
Basophils Absolute: 83 {cells}/uL (ref 0–200)
Basophils Relative: 1.2 %
Eosinophils Absolute: 179 {cells}/uL (ref 15–500)
Eosinophils Relative: 2.6 %
HCT: 41 % (ref 35.0–45.0)
Hemoglobin: 13.9 g/dL (ref 11.7–15.5)
MCH: 30.2 pg (ref 27.0–33.0)
MCHC: 33.9 g/dL (ref 32.0–36.0)
MCV: 89.1 fL (ref 80.0–100.0)
MPV: 11.6 fL (ref 7.5–12.5)
Monocytes Relative: 5.8 %
Neutro Abs: 4513 {cells}/uL (ref 1500–7800)
Neutrophils Relative %: 65.4 %
Platelets: 262 10*3/uL (ref 140–400)
RBC: 4.6 10*6/uL (ref 3.80–5.10)
RDW: 12.5 % (ref 11.0–15.0)
Total Lymphocyte: 25 %
WBC: 6.9 10*3/uL (ref 3.8–10.8)

## 2023-10-31 LAB — COMPREHENSIVE METABOLIC PANEL WITH GFR
AG Ratio: 2 (calc) (ref 1.0–2.5)
ALT: 13 U/L (ref 6–29)
AST: 21 U/L (ref 10–35)
Albumin: 4.8 g/dL (ref 3.6–5.1)
Alkaline phosphatase (APISO): 88 U/L (ref 37–153)
BUN: 12 mg/dL (ref 7–25)
CO2: 27 mmol/L (ref 20–32)
Calcium: 10.6 mg/dL — ABNORMAL HIGH (ref 8.6–10.4)
Chloride: 102 mmol/L (ref 98–110)
Creat: 0.65 mg/dL (ref 0.60–1.00)
Globulin: 2.4 g/dL (ref 1.9–3.7)
Glucose, Bld: 90 mg/dL (ref 65–99)
Potassium: 4.5 mmol/L (ref 3.5–5.3)
Sodium: 139 mmol/L (ref 135–146)
Total Bilirubin: 0.5 mg/dL (ref 0.2–1.2)
Total Protein: 7.2 g/dL (ref 6.1–8.1)
eGFR: 93 mL/min/{1.73_m2} (ref >=60)

## 2023-10-31 LAB — LIPID PANEL
Cholesterol: 278 mg/dL — ABNORMAL HIGH (ref <200)
HDL: 51 mg/dL (ref >=50)
LDL Cholesterol (Calc): 167 mg/dL — ABNORMAL HIGH
Non-HDL Cholesterol (Calc): 227 mg/dL — ABNORMAL HIGH (ref <130)
Total CHOL/HDL Ratio: 5.5 (calc) — ABNORMAL HIGH (ref <5.0)
Triglycerides: 354 mg/dL — ABNORMAL HIGH (ref <150)

## 2023-10-31 LAB — VITAMIN D 25 HYDROXY (VIT D DEFICIENCY, FRACTURES): Vit D, 25-Hydroxy: 99 ng/mL (ref 30–100)

## 2023-10-31 NOTE — Assessment & Plan Note (Signed)
 Assessment: Patient has borderline elevated cholesterol levels. Family history of coronary artery disease (mother had blocked arteries, though patient attributes this to diet rather than genetics). Patient follows heart-healthy Mediterranean-style diet rich in omega-3s, extra virgin olive oil, and limited animal products. Has recently reintroduced eggs in moderation. Previously used berberine supplement for cholesterol management. ASCVD 10-year risk calculated at 15.8%. Plan: Comprehensive lipid panel ordered Homocysteine level and ApoE genotyping per patient request Continue heart-healthy Mediterranean diet pattern Consider resuming berberine (1000-1500mg  daily) if lipid levels remain elevated Encourage daily tablespoon of extra virgin olive oil Discuss risks/benefits of statin therapy versus natural approaches after reviewing results Reassess in 3 months after implementation of dietary and supplement interventions

## 2023-10-31 NOTE — Assessment & Plan Note (Deleted)
 Patient expressed a preference to not move forward with

## 2023-10-31 NOTE — Assessment & Plan Note (Signed)
Updated problem overview for this problem to improve longitudinal management  

## 2023-10-31 NOTE — Progress Notes (Signed)
 Phone [phone]  -- Comprehensive Physical Exam (CPE) Annual Office Visit  --  Patient:  Tara Pruitt      Age: 73 y.o.       Sex:  female  Date:   10/30/2023 Today's Healthcare Provider: Anthon Kins, MD  ------------------------------------------------------------------------------------------------------------------------------------- Chief Complaint  Patient presents with   new pt     Pt is present to est care with pcp.    Purpose of Visit: Comprehensive preventive health assessment and personalized health maintenance planning.  This encounter was conducted as a Comprehensive Physical Exam (CPE) preventive care annual visit. The patient's medical history and problem list were reviewed to inform individualized preventive care recommendations.     Assessment & Plan Encounter for annual health examination Assessment: Patient reports progressive hair thinning. Concerned about further loss and interested in evaluation of potential hormonal factors. Plan: Order comprehensive hormone panel including thyroid studies, testosterone Assess nutritional factors that may contribute to hair loss (iron, biotin, zinc) Recommend maintaining adequate protein intake (0.8-1.0g/kg/day) Discuss low-level laser therapy options if hormonal and nutritional factors normal Consider dermatology referral if no improvement with initial interventions Hyperlipidemia, unspecified hyperlipidemia type Assessment: Patient has borderline elevated cholesterol levels. Family history of coronary artery disease (mother had blocked arteries, though patient attributes this to diet rather than genetics). Patient follows heart-healthy Mediterranean-style diet rich in omega-3s, extra virgin olive oil, and limited animal products. Has recently reintroduced eggs in moderation. Previously used berberine supplement for cholesterol management. ASCVD 10-year risk calculated at 15.8%. Plan: Comprehensive lipid panel  ordered Homocysteine level and ApoE genotyping per patient request Continue heart-healthy Mediterranean diet pattern Consider resuming berberine (1000-1500mg  daily) if lipid levels remain elevated Encourage daily tablespoon of extra virgin olive oil Discuss risks/benefits of statin therapy versus natural approaches after reviewing results Reassess in 3 months after implementation of dietary and supplement interventions Vitamin D deficiency Assessment: Patient reports limited sun exposure especially during winter months. Currently supplementing with vitamin D3 10,000 IU daily (5,000 IU twice daily). Plan: Check 25-OH vitamin D level Adjust supplementation based on results Target level 40-60 ng/mL Review calcium intake and bone health strategies that don't require radiation exposure Asymmetric SNHL (sensorineural hearing loss) Assessment: Patient experienced sudden sensorineural hearing loss in the left ear 15 years ago with associated severe vertigo and nausea at onset. 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 This has created ongoing social challenges despite good adaptation. Patient has been evaluated by multiple specialists including at [facility]. No recent changes in symptoms. Patient maintains concern about electromagnetic field exposure and is cautious about additional radiation exposure. Plan: Continue management with Dr. Claryce Cruel (ENT) Recommend environmental modifications for optimal hearing in right ear Discuss strategies for tinnitus management in noisy environments Provide education on available assistive devices if desired No indication for repeat imaging at this time given stable, longstanding condition Dizziness This is intermittent  and not very concerning to her. Hair thinning Assessment: Patient reports progressive hair thinning. Concerned about further loss and interested in evaluation of potential hormonal  factors. Plan: Order comprehensive hormone panel including thyroid studies, testosterone, per patient request  Assess nutritional factors that may contribute to hair loss (iron, biotin, zinc) Recommend maintaining adequate protein intake (0.8-1.0g/kg/day) Discuss low-level laser therapy options if hormonal and nutritional factors normal Consider dermatology referral if no improvement with initial interventions Latex allergy Assessment: Patient identified avocados as trigger for diarrhea through elimination testing. 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  She can tolerate small amounts without symptoms. Plan: Document as food sensitivity related to latex allergy Advise continued limited consumption based on tolerance Educate on other potential cross-reactive foods (banana, kiwi, chestnut) Consider food allergy testing only if symptoms worsen or new reactions develop Food sensitivity with gastrointestinal symptoms Assessment: Patient identified avocados as trigger for diarrhea through elimination testing. 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 She can tolerate small amounts without symptoms. 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 She has declined certain screenings like bone density tests due to radiation concerns but is up to date with mammograms and  colonoscopies. She is interested in checking vitamin D levels due to limited sun exposure and potential deficiency. A general health panel, including blood counts, metabolic panel, thyroid function tests, vitamin D level, homocysteine, and ApoE gene tests, is ordered.  Follow-up   She is advised to schedule a comprehensive annual physical and wellness visit at her convenience and encouraged to maintain regular health check-ups and screenings.   Diagnoses and all orders for this visit: Hyperlipidemia, unspecified hyperlipidemia type -     Apo E Genotyping: Cardio Risk -     Homocysteine -     Lipid panel Vitamin D deficiency -     Vitamin D (25 hydroxy) Asymmetric SNHL (sensorineural hearing loss) Dizziness -     Apo E Genotyping: Cardio Risk -     CBC with Differential/Platelet -     Comprehensive metabolic panel with GFR -     Homocysteine -     Lipid panel -     TSH+Prl+TestT+TestF+17OHP -     Vitamin D (25 hydroxy) Hair thinning -     TSH+Prl+TestT+TestF+17OHP Other orders -     TSH+Prl+TestT+TestF+17OHP    Today's preventive care visit included comprehensive health maintenance evaluation and gap closure alongside extensive anticipatory guidance, as follows: ANTICIPATORY GUIDANCE AND PREVENTIVE HEALTH COUNSELING Today's preventive care visit included comprehensive health maintenance evaluation and personalized anticipatory guidance based on the patient's specific preferences, values, and health concerns: Nutrition and Dietary Health Mediterranean Diet Pattern: Patient already follows many principles of the Mediterranean diet, including regular consumption of olive oil and fish. Reinforced evidence-based cardiovascular benefits of this approach. Olive Oil Consumption: Commended patient's daily use of extra virgin olive oil. Encouraged maintaining current practice of 1 tablespoon daily, which patient reports tolerating well without digestive issues unlike other oils. Fish  Consumption: Discussed continuing wild-caught salmon and sardine intake for omega-3 benefits. Acknowledged patient's appropriate decision to limit tuna due to mercury concerns. Egg Consumption: Supported patient's moderate approach to egg consumption (half egg daily). Discussed recent evidence that eggs can be part of a heart-healthy diet in moderation. Avocado Intake: Acknowledged patient's identified sensitivity to avocados in large amounts related to latex allergy. Supported continued modest consumption at levels she tolerates without symptoms. Cardiovascular Health Cholesterol Management: Discussed natural approaches to cholesterol management including olive oil, omega-3 rich foods, and berberine supplement that patient previously used with good results. Blood Pressure Monitoring: Acknowledged patient's observation that pork products  can elevate her blood pressure. Supported her decision to limit these foods. Natural Supplements: Discussed evidence for berberine as cholesterol management alternative to statins, acknowledging patient's preference for natural approaches. Hearing and Balance Health Cell Phone Safety: Supported patient's practice of using speakerphone rather than holding phone to ear based on her personal experience with hearing loss. Tinnitus Management: Discussed strategies for managing tinnitus in noisy environments, which patient identifies as particularly challenging. Communication Strategies: Reviewed positioning techniques to optimize hearing in group settings by facing speaker with right ear (functioning ear). Preventive Services Radiation Exposure Concerns: Acknowledged patient's concerns about radiation exposure from certain diagnostic tests. Respected her decision to decline bone density testing while emphasizing its clinical value. Cancer Screening: Commended patient for staying current with mammography. Acknowledged previous normal colonoscopies and agreement with previous  provider that screening can be discontinued based on age and negative history. Immunizations: Discussed value of pneumococcal vaccine update and Shingrix series to replace previous Zostavax. Acknowledged patient's concerns about certain vaccines while emphasizing protection benefits. Supplement and Medication Management Vitamin D Supplementation: Supported current vitamin D3 supplementation (10,000 IU daily) with plan to adjust based on lab results. Medication Sensitivity Awareness: Acknowledged significant adverse reaction to Tamiflu in the past. Assured vigilance in prescribing any new medications with consideration of this sensitivity history. Natural Approach Philosophy: Respected patient's preference for obtaining nutrients through food rather than supplements when possible. Supported gradual discontinuation of supplements that duplicate dietary intake (such as krill oil when eating fish regularly). Lifestyle Optimization Sleep Quality: Noted patient reports excellent sleep quality with no difficulties. Reinforced importance of maintaining good sleep habits. Coffee/Tea Intake: Recommended daily coffee or tea consumption for antioxidant benefits, noting patient's preference for half-decaffeinated coffee. Exercise: Encouraged maintaining regular physical activity, emphasizing low-impact options to preserve joint health. Intermittent Fasting: Discussed potential benefits of intermittent fasting patterns, which patient expressed interest in exploring. The patient demonstrated excellent health literacy and active engagement in her healthcare. She voiced understanding of recommendations and expressed appreciation for respecting her preferences for natural approaches when appropriate while providing evidence-based guidance.  Immunization History  Administered Date(s) Administered   Fluzone Influenza virus vaccine,trivalent (IIV3), split virus 05/25/2010, 05/09/2012, 05/08/2013   Influenza, High Dose  Seasonal PF 05/21/2018   Influenza,inj,Quad PF,6+ Mos 05/22/2016, 05/22/2017   Influenza,inj,quad, With Preservative 04/27/2014   Influenza-Unspecified 07/03/1998   Pneumococcal Conjugate-13 08/14/2014   Pneumococcal Polysaccharide-23 10/20/2014   Zoster, Live 01/12/2016   #  We attempted to update vaccination records and provide any missing vaccines that are recommended. Health Maintenance Due  Topic Date Due   COVID-19 Vaccine (1) Never done   Hepatitis C Screening  Never done   DTaP/Tdap/Td (1 - Tdap) Never done   Zoster Vaccines- Shingrix (1 of 2) 10/02/1969   Colonoscopy  Never done   MAMMOGRAM  Never done   DEXA SCAN  Never done   Pneumonia Vaccine 17+ Years old (3 of 3 - PCV20 or PCV21) 10/20/2019  # Incomplete health maintenance issues listed above were brought up for discussion and she was encouraged to complete with our assistance.  She is not interested colonoscopy and DEXA or shingrix at this time  # Recommended follow up:  continued annual preventive health maintenance exams  Subjective   She has Asymmetric SNHL (sensorineural hearing loss); Dizziness; and Elevated blood pressure reading in office with white coat syndrome, without diagnosis of hypertension on their problem list. History of Present Illness HISTORY OF PRESENT ILLNESS 73 year old female presenting for establishment of care with new PCP.  Patient is health-conscious and proactively manages her wellness through diet and lifestyle modifications. Hearing Loss: Patient experienced sudden sensorineural hearing loss in the left ear 15 years ago (2010) while at home. She initially noticed ear fullness that she attributed to a concurrent cold. The following morning, she experienced severe vertigo, profuse vomiting, and complete hearing loss in the left ear. She was evaluated by multiple otolaryngologists, including specialists at [facility], who diagnosed "sudden sensorineural hearing loss." This condition has caused persistent  tinnitus and impaired balance. The tinnitus worsens in noisy environments, creating significant social challenges despite her adaptability. She attributes this hearing loss to prolonged cellular phone use, noting this occurred during a stressful period when she frequently held her phone against her ear for extended conversations. She has since modified her behavior to use speakerphone exclusively. Dietary Health: Patient demonstrates strong nutritional awareness. She has eliminated pork products after noticing they elevated her blood pressure and caused digestive discomfort. She recently identified avocados as a trigger for diarrhea through elimination testing; this reaction appears to be related to her latex allergy (exhibits a latex-fruit syndrome pattern). She can tolerate small amounts of avocado without symptoms. She previously took vitamin E supplements but discontinued them after experiencing loose stools. She emphasizes consumption of anti-inflammatory foods, particularly extra virgin olive oil, which she tolerates well unlike other oils that cause digestive reflux. She regularly consumes wild-caught salmon and sardines for omega-3 benefits but limits tuna due to mercury concerns. Cardiovascular Health: Patient has borderline cholesterol levels with family history of coronary artery disease (mother had blocked arteries, though patient attributes this to her mother's diet rich in animal fats rather than genetics). She recently reintroduced eggs in limited quantities (half egg daily) after avoiding them for years due to cholesterol concerns. She previously took berberine for cholesterol management but has not recently refilled this supplement. She expresses interest in monitoring her lipid levels and avoiding pharmaceutical interventions if possible. Hair Concerns: Patient reports thinning hair, which she is concerned about and wishes to address before further loss occurs. She is interested in investigating  potential hormonal or nutritional causes. Medication Sensitivities: Patient recalls a significant adverse reaction to Tamiflu, which caused severe nausea, vomiting, and syncope resulting in a fall. She has a documented latex allergy and reports reactions to jewelry containing nickel. Patient has actively engaged in preventive health through self-directed nutritional approaches and is interested in evidence-based screening and preventive care, though she expresses caution regarding radiation exposure from certain diagnostic procedures. She declined bone density testing due to these concerns but remains current with other age-appropriate screenings including mammography.    ROS  A comprehensive ROS was negative for any concerning symptoms. She does have intermittent dizziness and diarrhea with avocado  PROBLEMS,PMH, PSH, FH, prior meds, allergies, and SH were each reviewed and updated:    10/30/2023    2:10 PM  Depression screen PHQ 2/9  Decreased Interest 0  Down, Depressed, Hopeless 0  PHQ - 2 Score 0  Altered sleeping 0  Tired, decreased energy 0  Change in appetite 0  Feeling bad or failure about yourself  0  Trouble concentrating 0  Moving slowly or fidgety/restless 0  Suicidal thoughts 0  PHQ-9 Score 0  Difficult doing work/chores Not difficult at all   Patient Active Problem List   Diagnosis Date Noted   Asymmetric SNHL (sensorineural hearing loss) 05/21/2023   Dizziness 05/21/2023   Elevated blood pressure reading in office with white coat syndrome, without diagnosis of hypertension 05/21/2023  Past Medical History:  Diagnosis Date   Glaucoma     Past Surgical History:  Procedure Laterality Date   CATARACT EXTRACTION     CESAREAN SECTION     TONSILLECTOMY     History reviewed. No pertinent family history. Allergies  Allergen Reactions   Codeine Nausea And Vomiting   Latex    Tamiflu [Oseltamivir]     Social History   Tobacco Use   Smoking status: Former     Current packs/day: 0.00    Types: Cigarettes    Quit date: 1989    Years since quitting: 36.3   Smokeless tobacco: Never  Substance Use Topics   Alcohol use: Never   Drug use: Never    I attest that I have reviewed and confirmed the patients current medications to meet the medication reconciliation requirement Current Outpatient Medications on File Prior to Visit  Medication Sig   ascorbic acid (QC VITAMIN C WITH ROSE HIPS) 500 MG tablet Take 500 mg by mouth daily.   Ascorbic Acid (VITAMIN C) 100 MG tablet Take 100 mg by mouth daily.   B COMPLEX-C PO Take 150 mg by mouth daily.   Barberry-Oreg Grape-Goldenseal (BERBERINE COMPLEX) 200-200-50 MG CAPS Take 600 mg by mouth 2 (two) times daily.   Bilberry 500 MG CAPS Take by mouth 2 (two) times daily.   Brompheniramine-Pseudoeph (BROMALINE PO) Take by mouth daily.   Calcium Carbonate (CALCIUM 600 PO) Take by mouth daily.   cholecalciferol (VITAMIN D3) 25 MCG (1000 UNIT) tablet Take 5,000 Units by mouth daily.   ciclopirox (PENLAC) 8 % solution Apply topically at bedtime. Apply over nail and surrounding skin. Apply daily over previous coat. After seven (7) days, may remove with alcohol and continue cycle.   Coenzyme Q10 (CO Q 10) 100 MG CAPS Take by mouth daily.   latanoprost (XALATAN) 0.005 % ophthalmic solution Place 1 drop into both eyes at bedtime.   magnesium 30 MG tablet Take 30 mg by mouth 2 (two) times daily. Glycinate   Multiple Vitamin (MULTIVITAMIN) tablet Take 1 tablet by mouth daily.   POTASSIUM PO Take by mouth daily.   QUERCETIN PO Take 800 mg by mouth 2 (two) times daily.   Selenium 200 MCG CAPS Take by mouth. Take 1/2 tablet in the morning and at night   TART CHERRY PO Take by mouth daily.   zinc gluconate 50 MG tablet Take 50 mg by mouth daily.   No current facility-administered medications on file prior to visit.   Medications Discontinued During This Encounter  Medication Reason   Krill Oil 500 MG CAPS Completed  Course   methocarbamol (ROBAXIN) 500 MG tablet Completed Course   naproxen sodium (ALEVE) 220 MG tablet Completed Course   vitamin E 1000 UNIT capsule Completed Course   triamcinolone (KENALOG) 0.025 % ointment Completed Course   Objective  BP 138/82   Pulse 91   Temp 97.8 F (36.6 C) (Temporal)   Ht 5' 5.5" (1.664 m)   Wt 153 lb 6.4 oz (69.6 kg)   LMP  (LMP Unknown)   SpO2 98%   BMI 25.14 kg/m  Body mass index is 25.14 kg/m.  Wt Readings from Last 3 Encounters:  10/30/23 153 lb 6.4 oz (69.6 kg)  05/19/23 155 lb (70.3 kg)  01/15/23 155 lb 3.2 oz (70.4 kg)    GENERAL:  NAD, AAO, not ill-appearing  HENT:  NCAT, normal nose, mucous membranes moist.  Tympanic membrane evaluated and normal appearing bilaterally, oropharynx evaluated and  normal appearing EYES:  sclera nonicteric, no injection CV:  RRR, no murmurs/rubs/gallops LUNG: CTAB, normal WOB, no audible wheezing or stridor ABD: soft, nondistended, no guarding, no palpable tumor GYN:  expressed a preference to do breast and gynecological exam at another office which  I supported and encouraged explaining the importance of routine gynecological screenings. SKIN: warm, dry, no lesions of concern NEURO: alert, no focal deficit obvious, articulate speech PSYCH: normal mood, behavior, thought content    Notes:  This document was synthesized by artificial intelligence ([employer]bridge) using HIPAA-compliant recording of the clinical interaction;   We discussed the use of AI scribe software for clinical note transcription with the patient, who gave verbal consent to proceed.    This encounter employed state-of-the-art, real-time, collaborative documentation. The patient was empowered to actively review and assist in updating their electronic medical record on a shared monitor, ensuring transparency and improving accuracy.    Prior to and at the beginning of Comprehensive Physical Exam (CPE) preventive care annual visit appointment types   we clarify to patients "Our goal today is to focus on your preventive or annual Comprehensive Physical Exam (CPE) preventive care annual visit, which typically covers routine screenings and overall health maintenance. However, if you share any new or concerning symptoms--such as dizziness, passing out, severe pain, or anything else that may point to a more serious issue--we are both legally and ethically required to evaluate it. We cannot simply overlook or ignore such concerns, even if you later decide you don't want to discuss them, because it could jeopardize your health.  If addressing a new concern takes us  beyond the scope of the preventive visit, we may need to bill separately for that portion of care. We understand financial considerations are important, and we're happy to discuss your options if something new comes up. However, we want to be clear that once you mention a potentially serious issue, we must investigate it; we can't ethically or legally exclude that from our records or our evaluation. Please let us  know all of your questions or worries. Together, we can decide how best to manage them and how to minimize any unexpected costs, but we want to keep you safe above all else."   This disclosure is mandated by professional ethics and legal obligations, as healthcare providers must address any substantial health concerns raised during any patient interaction and a comprehensive ROS is required by insurance companies for billing preventive-care visit type.    Signed: Anthon Kins, MD  [facility] [address] [place], [place] [address] Office:  [phone]    Health Maintenance, Female Adopting a healthy lifestyle and getting preventive care are important in promoting health and wellness. Ask your health care provider about: The right schedule for you to have regular tests and exams. Things you can do on your own to prevent diseases and keep yourself  healthy. What should I know about diet, weight, and exercise? Eat a healthy diet  Eat a diet that includes plenty of vegetables, fruits, low-fat dairy products, and lean protein. Do not eat a lot of foods that are high in solid fats, added sugars, or sodium. Maintain a healthy weight Body mass index (BMI) is used to identify weight problems. It estimates body fat based on height and weight. Your health care provider can help determine your BMI and help you achieve or maintain a healthy weight. Get regular exercise Get regular exercise. This is one of the most important things  you can do for your health. Most adults should: Exercise for at least 150 minutes each week. The exercise should increase your heart rate and make you sweat (moderate-intensity exercise). Do strengthening exercises at least twice a week. This is in addition to the moderate-intensity exercise. Spend less time sitting. Even light physical activity can be beneficial. Watch cholesterol and blood lipids Have your blood tested for lipids and cholesterol at 73 years of age, then have this test every 5 years. Have your cholesterol levels checked more often if: Your lipid or cholesterol levels are high. You are older than 73 years of age. You are at high risk for heart disease. What should I know about cancer screening? Depending on your health history and family history, you may need to have cancer screening at various ages. This may include screening for: Breast cancer. Cervical cancer. Colorectal cancer. Skin cancer. Lung cancer. What should I know about heart disease, diabetes, and high blood pressure? Blood pressure and heart disease High blood pressure causes heart disease and increases the risk of stroke. This is more likely to develop in people who have high blood pressure readings or are overweight. Have your blood pressure checked: Every 3-5 years if you are 21-21 years of age. Every year if you are 10 years old  or older. Diabetes Have regular diabetes screenings. This checks your fasting blood sugar level. Have the screening done: Once every three years after age 64 if you are at a normal weight and have a low risk for diabetes. More often and at a younger age if you are overweight or have a high risk for diabetes. What should I know about preventing infection? Hepatitis B If you have a higher risk for hepatitis B, you should be screened for this virus. Talk with your health care provider to find out if you are at risk for hepatitis B infection. Hepatitis C Testing is recommended for: Everyone born from [birth year] through 1965. Anyone with known risk factors for hepatitis C. Sexually transmitted infections (STIs) Get screened for STIs, including gonorrhea and chlamydia, if: You are sexually active and are younger than 73 years of age. You are older than 73 years of age and your health care provider tells you that you are at risk for this type of infection. Your sexual activity has changed since you were last screened, and you are at increased risk for chlamydia or gonorrhea. Ask your health care provider if you are at risk. Ask your health care provider about whether you are at high risk for HIV. Your health care provider may recommend a prescription medicine to help prevent HIV infection. If you choose to take medicine to prevent HIV, you should first get tested for HIV. You should then be tested every 3 months for as long as you are taking the medicine. Pregnancy If you are about to stop having your period (premenopausal) and you may become pregnant, seek counseling before you get pregnant. Take 400 to 800 micrograms (mcg) of folic acid every day if you become pregnant. Ask for birth control (contraception) if you want to prevent pregnancy. Osteoporosis and menopause Osteoporosis is a disease in which the bones lose minerals and strength with aging. This can result in bone fractures. If you are 34 years  old or older, or if you are at risk for osteoporosis and fractures, ask your health care provider if you should: Be screened for bone loss. Take a calcium or vitamin D supplement to lower your risk of fractures.  Be given hormone replacement therapy (HRT) to treat symptoms of menopause. Follow these instructions at home: Alcohol use Do not drink alcohol if: Your health care provider tells you not to drink. You are pregnant, may be pregnant, or are planning to become pregnant. If you drink alcohol: Limit how much you have to: 0-1 drink a day. Know how much alcohol is in your drink. In the [place] one drink equals one 12 oz bottle of beer (355 mL), one 5 oz glass of wine (148 mL), or one 1 oz glass of hard liquor (44 mL). Lifestyle Do not use any products that contain nicotine or tobacco. These products include cigarettes, chewing tobacco, and vaping devices, such as e-cigarettes. If you need help quitting, ask your health care provider. Do not use street drugs. Do not share needles. Ask your health care provider for help if you need support or information about quitting drugs. General instructions Schedule regular health, dental, and eye exams. Stay current with your vaccines. Tell your health care provider if: You often feel depressed. You have ever been abused or do not feel safe at home. Summary Adopting a healthy lifestyle and getting preventive care are important in promoting health and wellness. Follow your health care provider's instructions about healthy diet, exercising, and getting tested or screened for diseases. Follow your health care provider's instructions on monitoring your cholesterol and blood pressure. This information is not intended to replace advice given to you by your health care provider. Make sure you discuss any questions you have with your health care provider. Document Revised: 11/26/2020 Document Reviewed: 11/26/2020 Elsevier Patient Education  2024 [employer].

## 2023-10-31 NOTE — Assessment & Plan Note (Signed)
 Assessment: Patient reports limited sun exposure especially during winter months. Currently supplementing with vitamin D3 10,000 IU daily (5,000 IU twice daily). Plan: Check 25-OH vitamin D level Adjust supplementation based on results Target level 40-60 ng/mL Review calcium intake and bone health strategies that don't require radiation exposure

## 2023-10-31 NOTE — Assessment & Plan Note (Signed)
 This is intermittent  and not very concerning to her.

## 2023-10-31 NOTE — Assessment & Plan Note (Signed)
 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 This has created ongoing social challenges despite good adaptation. Patient has been evaluated by multiple specialists including at [facility]. No recent changes in symptoms. Patient maintains concern about electromagnetic field exposure and is cautious about additional radiation exposure. Plan: Continue management with Dr. Claryce Cruel (ENT) Recommend environmental modifications for optimal hearing in right ear Discuss strategies for tinnitus management in noisy environments Provide education on available assistive devices if desired No indication for repeat imaging at this time given stable, longstanding condition

## 2023-10-31 NOTE — Assessment & Plan Note (Signed)
 Assessment: Patient identified avocados as trigger for diarrhea through elimination testing. 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 She can tolerate small amounts without symptoms. Plan: Document as food sensitivity related to latex allergy Advise continued limited consumption based on tolerance Educate on other potential cross-reactive foods (banana, kiwi, chestnut) Consider food allergy testing only if symptoms worsen or new reactions develop

## 2023-10-31 NOTE — Assessment & Plan Note (Signed)
 Assessment: Patient reports progressive hair thinning. Concerned about further loss and interested in evaluation of potential hormonal factors. Plan: Order comprehensive hormone panel including thyroid studies, testosterone, per patient request  Assess nutritional factors that may contribute to hair loss (iron, biotin, zinc) Recommend maintaining adequate protein intake (0.8-1.0g/kg/day) Discuss low-level laser therapy options if hormonal and nutritional factors normal Consider dermatology referral if no improvement with initial interventions

## 2023-10-31 NOTE — Patient Instructions (Signed)
 YOUR VISIT SUMMARY - 10/30/2023   Thank you for your visit today. Here is a summary of your health assessment and next steps: YOUR HEALTH PLAN   Health Topic Recommendations  Laboratory Testing  Complete ordered lab tests as soon as possible  Testing includes comprehensive health panel, cholesterol levels, vitamin D, and hormone testing  No fasting required unless specifically instructed  Results will be available in 1-2 weeks   Heart Health  Continue Mediterranean-style eating pattern  Include 1 tablespoon of extra virgin olive oil daily  Maintain fish consumption 2-3 times weekly  Consider resuming berberine supplement if previously helpful  Limit processed foods and added sugars  Aim for 150 minutes of moderate activity weekly   Hearing Management  Continue using speakerphone instead of holding phone to ear  Consider noise reduction strategies in loud environments  Position yourself with good ear toward speaker in group settings  Follow up with ENT specialist as needed for tinnitus management   Nutrition  Continue vitamin D supplementation until lab results reviewed  Maintain limited avocado consumption based on tolerance  Include protein sources with each meal for hair health  Consider food journal if new digestive symptoms develop   Preventive Care  Annual mammogram completed - excellent!  Due for pneumococcal vaccine update  Consider Shingrix vaccine series (replaces previous Zostavax)  Annual influenza vaccine recommended   IMPORTANT: If you experience severe dizziness, fainting, chest pain, or any concerning new symptoms, please seek immediate medical attention.  FOLLOW-UP PLAN   Lab Results Results will be available in 1-2 weeks. We will contact you to discuss findings.  Next Appointment Please schedule your annual physical in approximately 3 months, or sooner if new concerns arise.  Medication Changes Continue current supplements as discussed. Medication recommendations may  be updated based on lab results.   HELPFUL RESOURCES: ?? American Tinnitus Association - Information about managing tinnitus: CookingConference.be ?? Mediterranean Diet Resources - Heart-healthy recipes and meal plans ?? Vitamin D Council - Information about vitamin D and optimal levels: www.vitamindcouncil.org      Please contact our office if you have any questions or concerns before your next visit. We're committed to your health and wellness!

## 2023-11-02 LAB — HOMOCYSTEINE: Homocysteine: 7.5 umol/L (ref <10.4)

## 2023-11-02 NOTE — Telephone Encounter (Signed)
 All lab results have now resulted. Any updates or changes for patient before calling to advise lab results?

## 2023-11-06 LAB — APO E GENOTYPING: CARDIO RISK

## 2023-11-06 LAB — TSH+PRL+TESTT+TESTF+17OHP
17-Hydroxyprogesterone: 20 ng/dL
Prolactin: 5 ng/mL (ref 3.6–25.2)
TSH: 2.06 u[IU]/mL (ref 0.450–4.500)
Testosterone, Free: 1.1 pg/mL (ref 0.0–4.2)
Testosterone, Total, LC/MS: 10.1 ng/dL (ref 7.0–40.0)

## 2023-11-12 ENCOUNTER — Encounter: Payer: Self-pay | Admitting: *Deleted

## 2023-11-12 ENCOUNTER — Telehealth: Payer: Self-pay | Admitting: *Deleted

## 2023-11-12 NOTE — Telephone Encounter (Signed)
 Copied from CRM [id number]. Topic: Clinical - Lab/Test Results >> Nov 12, 2023  3:22 PM Luane Rumps D wrote: Reason for CRM: Ms. Gonzalo calling in regards to recent lab test results from her visit on 4/11. Patient would like all test results mailed to her address including any provider comments/concerns. If there is anything needed from her she prefers phone calls as she is unable to use MyChart due to having an old phone.  Results mailed to address on file per patient request.

## 2023-12-16 ENCOUNTER — Encounter

## 2024-01-06 ENCOUNTER — Encounter: Payer: Medicare Other | Admitting: Nurse Practitioner

## 2024-01-18 ENCOUNTER — Encounter: Payer: Medicare Other | Admitting: Nurse Practitioner

## 2024-01-21 ENCOUNTER — Encounter: Payer: Medicare Other | Admitting: Nurse Practitioner

## 2024-04-28 ENCOUNTER — Ambulatory Visit: Payer: Medicare Other | Admitting: Nurse Practitioner

## 2024-05-18 ENCOUNTER — Ambulatory Visit: Payer: Medicare Other | Admitting: Nurse Practitioner

## 2024-05-26 ENCOUNTER — Ambulatory Visit

## 2024-06-02 ENCOUNTER — Ambulatory Visit

## 2024-06-23 ENCOUNTER — Ambulatory Visit

## 2024-08-18 ENCOUNTER — Ambulatory Visit

## 2024-08-25 ENCOUNTER — Ambulatory Visit

## 2024-11-02 ENCOUNTER — Ambulatory Visit: Admitting: Internal Medicine
